# Patient Record
Sex: Female | Born: 1960 | Race: Black or African American | Hispanic: No | Marital: Married | Smoking: Former smoker
Health system: Southern US, Community
[De-identification: ages and names within clinical notes are randomized; demographics above are authoritative.]

## PROBLEM LIST (undated history)

## (undated) DIAGNOSIS — D126 Benign neoplasm of colon, unspecified: Secondary | ICD-10-CM

## (undated) DIAGNOSIS — M7062 Trochanteric bursitis, left hip: Secondary | ICD-10-CM

## (undated) DIAGNOSIS — Z8619 Personal history of other infectious and parasitic diseases: Secondary | ICD-10-CM

## (undated) DIAGNOSIS — R002 Palpitations: Secondary | ICD-10-CM

## (undated) DIAGNOSIS — M545 Low back pain, unspecified: Secondary | ICD-10-CM

## (undated) HISTORY — DX: Personal history of other infectious and parasitic diseases: Z86.19

## (undated) HISTORY — PX: LIPOMA EXCISION: SHX5283

## (undated) HISTORY — PX: BREAST BIOPSY: SHX20

## (undated) HISTORY — DX: Low back pain: M54.5

## (undated) HISTORY — DX: Trochanteric bursitis, left hip: M70.62

## (undated) HISTORY — DX: Low back pain, unspecified: M54.50

## (undated) HISTORY — DX: Palpitations: R00.2

## (undated) HISTORY — PX: COLONOSCOPY: SHX174

## (undated) HISTORY — PX: OTHER SURGICAL HISTORY: SHX169

## (undated) HISTORY — DX: Benign neoplasm of colon, unspecified: D12.6

---

## 1997-11-04 ENCOUNTER — Emergency Department (HOSPITAL_COMMUNITY): Admission: EM | Admit: 1997-11-04 | Discharge: 1997-11-04 | Payer: Self-pay | Admitting: Emergency Medicine

## 1999-08-13 ENCOUNTER — Ambulatory Visit (HOSPITAL_COMMUNITY): Admission: RE | Admit: 1999-08-13 | Discharge: 1999-08-13 | Payer: Self-pay | Admitting: General Surgery

## 1999-08-13 ENCOUNTER — Encounter (INDEPENDENT_AMBULATORY_CARE_PROVIDER_SITE_OTHER): Payer: Self-pay

## 2004-11-20 ENCOUNTER — Emergency Department (HOSPITAL_COMMUNITY): Admission: EM | Admit: 2004-11-20 | Discharge: 2004-11-20 | Payer: Self-pay | Admitting: Emergency Medicine

## 2010-01-11 ENCOUNTER — Encounter: Payer: Self-pay | Admitting: Cardiovascular Disease

## 2010-01-25 ENCOUNTER — Ambulatory Visit: Payer: Self-pay | Admitting: Cardiovascular Disease

## 2010-01-25 DIAGNOSIS — R0602 Shortness of breath: Secondary | ICD-10-CM | POA: Insufficient documentation

## 2010-01-25 DIAGNOSIS — R002 Palpitations: Secondary | ICD-10-CM

## 2010-07-16 NOTE — Letter (Signed)
Summary: PHI  PHI   Imported By: Harlon Flor 01/28/2010 10:43:17  _____________________________________________________________________  External Attachment:    Type:   Image     Comment:   External Document

## 2010-07-16 NOTE — Assessment & Plan Note (Signed)
Summary: NEW PT   Visit Type:  Initial Consult Primary Provider:  Dr. Sherrie Mustache  CC:  Family hx. of CAD.  c/o shortness of breath at times with skipping beats in heart.  .  History of Present Illness: 50 year old woman with no known cardiac history, family history of diabetes and heart disease who is a patient of Dr. Sherrie Mustache who presents for new patient evaluation for symptoms of tachycardia, chest tightness.  She reports that she is very active. She uses the elliptical and walks several times per week. She can walk at a regular/fast pace for 35-45 minutes with no symptoms of shortness of breath or chest discomfort.  Sometimes when she sits on the couch, she feels that her heart rate is beating up. This happens for several minutes at a time and then goes back to normal. she does not have this when she exercises. She denies any lower extremity edema, lightheadedness, shortness of breath.  She used to smoke but stopped approximately 10-15 years ago. Chest pain seems to come on at rest it is brief, in the middle of her chest. No associated with exertion.  EKG shows normal sinus rhythm with rate of 64 beats per minute, no significant ST or T wave changes.  Current Medications (verified): 1)  None  Allergies (verified): No Known Drug Allergies  Past History:  Past Surgical History: Last updated: January 30, 2010 Benign breast biopsy Fissurotomy  Family History: Last updated: 2010-01-30 Father: deceased at age 60.  Cause of death was cancer and heart disease. Mother: HTN, non-insulin dependent diabetes, alsheimer's disease. Patient's mother is living.  Brother: CHF and stroke Brother: CAD age 35's Sister: CAD with a stent at 38.  Social History: Last updated: 2010/01/30 Tobacco Use - Former. Previously smoked less than 1/2 pack per day for 5-10 years. Alcohol Use - yes occasional  Regular Exercise - yes walking  Full Time Divorced   Risk Factors: Exercise: yes (01-30-2010)  Risk  Factors: Smoking Status: quit (30-Jan-2010)  Family History: Father: deceased at age 102.  Cause of death was cancer and heart disease. Mother: HTN, non-insulin dependent diabetes, alsheimer's disease. Patient's mother is living.  Brother: CHF and stroke Brother: CAD age 4's Sister: CAD with a stent at 86.  Social History: Tobacco Use - Former. Previously smoked less than 1/2 pack per day for 5-10 years. Alcohol Use - yes occasional  Regular Exercise - yes walking  Full Time Divorced   Vital Signs:  Patient profile:   50 year old female Height:      69 inches Weight:      189 pounds BMI:     28.01 Pulse rate:   69 / minute BP sitting:   108 / 69  (left arm) Cuff size:   regular  Vitals Entered By: Bishop Dublin, CMA (2010-01-30 2:29 PM)  Physical Exam  General:  Well developed, well nourished, in no acute distress. Head:  normocephalic and atraumatic Neck:  Neck supple, no JVD. No masses, thyromegaly or abnormal cervical nodes. Lungs:  Clear bilaterally to auscultation and percussion. Heart:  Non-displaced PMI, chest non-tender; regular rate and rhythm, S1, S2 without murmurs, rubs or gallops. Carotid upstroke normal, no bruit. Pedals normal pulses. No edema, no varicosities. Abdomen:  Bowel sounds positive; abdomen soft and non-tender without masses Msk:  Back normal, normal gait. Muscle strength and tone normal. Pulses:  pulses normal in all 4 extremities Extremities:  No clubbing or cyanosis. Neurologic:  Alert and oriented x 3. Skin:  Intact  without lesions or rashes. Psych:  Normal affect.   Impression & Recommendations:  Problem # 1:  PALPITATIONS (ICD-785.1) palpitations/tachycardia may be due to an atrial tachycardia or some other rhythm that has not been identified. It is rare when they happen, brief period she is mildly symptomatic. She's not interested in taking any medication for it at this time. We did mention that if her symptoms get worse, we did  do an event monitor or Holter monitor. She will contact us if he gets worse. As his symptoms are rare and last only several minutes, medications would likely be of little benefit when taken p.r.n.  Orders: EKG w/ Interpretation (93000)  Problem # 2:  SHORTNESS OF BREATH (ICD-786.05) She is reactive, exercises without symptoms. It is very unlikely that she has underlying severe coronary disease with her exercise tolerance and lack of symptoms. Have asked her to contact me if she has worsening symptoms of shortness of breath or chest discomfort with exertion  Orders: EKG w/ Interpretation (93000)

## 2010-12-27 ENCOUNTER — Encounter: Payer: Self-pay | Admitting: Cardiovascular Disease

## 2011-09-02 LAB — HM PAP SMEAR: HM Pap smear: NEGATIVE

## 2012-10-19 DIAGNOSIS — N852 Hypertrophy of uterus: Secondary | ICD-10-CM | POA: Insufficient documentation

## 2012-11-02 LAB — HM HEPATITIS C SCREENING LAB: HM HEPATITIS C SCREENING: NEGATIVE

## 2013-08-25 ENCOUNTER — Ambulatory Visit: Payer: Self-pay | Admitting: Family Medicine

## 2013-12-05 ENCOUNTER — Ambulatory Visit (INDEPENDENT_AMBULATORY_CARE_PROVIDER_SITE_OTHER): Payer: Private Health Insurance - Indemnity | Admitting: Cardiovascular Disease

## 2013-12-05 ENCOUNTER — Encounter: Payer: Self-pay | Admitting: Cardiovascular Disease

## 2013-12-05 ENCOUNTER — Encounter (INDEPENDENT_AMBULATORY_CARE_PROVIDER_SITE_OTHER): Payer: Self-pay

## 2013-12-05 VITALS — BP 124/82 | HR 68 | Ht 69.0 in | Wt 204.8 lb

## 2013-12-05 DIAGNOSIS — E785 Hyperlipidemia, unspecified: Secondary | ICD-10-CM

## 2013-12-05 DIAGNOSIS — R0602 Shortness of breath: Secondary | ICD-10-CM

## 2013-12-05 DIAGNOSIS — R002 Palpitations: Secondary | ICD-10-CM

## 2013-12-05 NOTE — Assessment & Plan Note (Signed)
We talked about her cholesterol. We do not have the most recent numbers. She does not want a prescription and I do not think this is necessary. She will try weight loss, exercise, watching her diet and consider red yeast rice

## 2013-12-05 NOTE — Progress Notes (Signed)
   Patient ID: Tina Fuller, female    DOB: October 10, 1960, 53 y.o.   MRN: [id number]  HPI Comments: 53 year old woman with no known cardiac history, family history of diabetes and heart disease who is a patient of Dr. Caryn Section who was previously seen in 2011 for tachycardia, chest tightness. She presents for routine followup  In followup today, she continues to work at [employer]. She spends long hours on her feet, does significant lifting and walking without symptoms. Occasionally she has shortness of breath with exertion. She feels tired, has arthritis in her back. She is no longer exercising as she use to. She denies any further episodes of tachycardia. She does have some lower extremity edema which she attributes to standing all day. Minimal chest discomfort, they're very mild, at rest, not with exertion   She used to smoke but stopped approximately 10-15 years ago. Chest pain seems to come on at rest it is brief, in the middle of her chest. No associated with exertion.  Labwork from may    2014 showing total cholesterol 200, LDL 111, creatinine 0.74 EKG shows normal sinus rhythm with rate of 68 beats per minute, no significant ST or T wave changes.   Outpatient Encounter Prescriptions as of 12/05/2013  Medication Sig  . HYDROcodone-acetaminophen (NORCO/VICODIN) 5-325 MG per tablet 1-2 tablets every 4 (four) hours as needed. 1 tablet every 4 (four) hours as needed.  . Multiple Vitamin (MULTIVITAMIN) tablet Take 1 tablet by mouth daily.     Review of Systems  Constitutional: Positive for fatigue.  HENT: Negative.   Eyes: Negative.   Respiratory: Negative.   Cardiovascular: Negative.   Gastrointestinal: Negative.   Endocrine: Negative.   Musculoskeletal: Negative.   Skin: Negative.   Allergic/Immunologic: Negative.   Neurological: Negative.   Hematological: Negative.   Psychiatric/Behavioral: Negative.   All other systems reviewed and are negative.   BP 124/82  Pulse 68   Ht 5\' 9"  (1.753 m)  Wt 204 lb 12 oz (92.874 kg)  BMI 30.22 kg/m2  Physical Exam  Nursing note and vitals reviewed. Constitutional: She is oriented to person, place, and time. She appears well-developed and well-nourished.  HENT:  Head: Normocephalic.  Nose: Nose normal.  Mouth/Throat: Oropharynx is clear and moist.  Eyes: Conjunctivae are normal. Pupils are equal, round, and reactive to light.  Neck: Normal range of motion. Neck supple. No JVD present.  Cardiovascular: Normal rate, regular rhythm, S1 normal, S2 normal, normal heart sounds and intact distal pulses.  Exam reveals no gallop and no friction rub.   No murmur heard. Pulmonary/Chest: Effort normal and breath sounds normal. No respiratory distress. She has no wheezes. She has no rales. She exhibits no tenderness.  Abdominal: Soft. Bowel sounds are normal. She exhibits no distension. There is no tenderness.  Musculoskeletal: Normal range of motion. She exhibits no edema and no tenderness.  Lymphadenopathy:    She has no cervical adenopathy.  Neurological: She is alert and oriented to person, place, and time. Coordination normal.  Skin: Skin is warm and dry. No rash noted. No erythema.  Psychiatric: She has a normal mood and affect. Her behavior is normal. Judgment and thought content normal.    Assessment and Plan

## 2013-12-05 NOTE — Patient Instructions (Signed)
You are doing well. No medication changes were made.  Look for RED YEAST RICE for cholesterol  Please call us if you have new issues that need to be addressed before your next appt.  Your physician wants you to follow-up in: 12 months.  You will receive a reminder letter in the mail two months in advance. If you don't receive a letter, please call our office to schedule the follow-up appointment.

## 2013-12-05 NOTE — Assessment & Plan Note (Signed)
She denies having significant symptoms of palpitations or tachycardia.

## 2013-12-05 NOTE — Assessment & Plan Note (Signed)
Premolded and will symptoms. She is active at work. Likely secondary to deconditioning and weight. Recommended she start a regular exercise program clinical exam is essentially benign

## 2014-08-15 DIAGNOSIS — D126 Benign neoplasm of colon, unspecified: Secondary | ICD-10-CM

## 2014-08-15 HISTORY — DX: Benign neoplasm of colon, unspecified: D12.6

## 2014-08-29 ENCOUNTER — Ambulatory Visit: Payer: Self-pay | Admitting: Gastroenterology

## 2014-09-21 LAB — LIPID PANEL
CHOLESTEROL: 197 mg/dL (ref 0–200)
HDL: 88 mg/dL — AB (ref 35–70)
LDL Cholesterol: 97 mg/dL
TRIGLYCERIDES: 62 mg/dL (ref 40–160)

## 2014-09-21 LAB — BASIC METABOLIC PANEL
BUN: 10 mg/dL (ref 4–21)
Creatinine: 0.6 mg/dL (ref 0.5–1.1)
Glucose: 90 mg/dL
Potassium: 4.5 mmol/L (ref 3.4–5.3)
SODIUM: 139 mmol/L (ref 137–147)

## 2014-09-21 LAB — HEPATIC FUNCTION PANEL
ALT: 9 U/L (ref 7–35)
AST: 16 U/L (ref 13–35)

## 2014-10-09 LAB — SURGICAL PATHOLOGY

## 2014-12-27 ENCOUNTER — Ambulatory Visit: Payer: Private Health Insurance - Indemnity | Admitting: Cardiovascular Disease

## 2014-12-29 ENCOUNTER — Encounter: Payer: Self-pay | Admitting: *Deleted

## 2014-12-29 ENCOUNTER — Ambulatory Visit: Payer: Private Health Insurance - Indemnity | Admitting: Cardiovascular Disease

## 2015-01-31 LAB — HM PAP SMEAR: HM PAP: NEGATIVE

## 2015-02-05 DIAGNOSIS — E010 Iodine-deficiency related diffuse (endemic) goiter: Secondary | ICD-10-CM | POA: Insufficient documentation

## 2015-02-22 LAB — HM MAMMOGRAPHY

## 2015-03-21 ENCOUNTER — Encounter: Payer: Self-pay | Admitting: Cardiovascular Disease

## 2015-03-21 ENCOUNTER — Ambulatory Visit (INDEPENDENT_AMBULATORY_CARE_PROVIDER_SITE_OTHER): Payer: Managed Care, Other (non HMO) | Admitting: Cardiovascular Disease

## 2015-03-21 VITALS — BP 128/70 | HR 83 | Ht 68.0 in | Wt 208.2 lb

## 2015-03-21 DIAGNOSIS — E785 Hyperlipidemia, unspecified: Secondary | ICD-10-CM

## 2015-03-21 DIAGNOSIS — R0602 Shortness of breath: Secondary | ICD-10-CM

## 2015-03-21 DIAGNOSIS — E669 Obesity, unspecified: Secondary | ICD-10-CM

## 2015-03-21 DIAGNOSIS — R002 Palpitations: Secondary | ICD-10-CM

## 2015-03-21 NOTE — Patient Instructions (Signed)
You are doing well. No medication changes were made.  Please call us if you have new issues that need to be addressed before your next appt.  Your physician wants you to follow-up in: 12 months.  You will receive a reminder letter in the mail two months in advance. If you don't receive a letter, please call our office to schedule the follow-up appointment. 

## 2015-03-21 NOTE — Progress Notes (Signed)
Patient ID: Tina Fuller, female    DOB: 09-18-1960, 54 y.o.   MRN: [id number]  HPI Comments: 54 year old woman with obesity,  no known cardiac history, family history of diabetes and heart disease who is a patient of Dr. Caryn Section who was previously seen in 2011 for tachycardia, chest tightness. She presents for routine followup of her blood pressure, risk factors.  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  She spends long hours on her feet, does significant lifting and walking without symptoms.  Weight is up 3 pounds from her prior clinic visit  She is no longer exercising as she use to. She denies any further episodes of tachycardia.  She does have some lower extremity edema which she attributes to standing all day.   She used to smoke but stopped approximately 10-15 years ago. She reports strong family history of congestive heart failure  EKG shows normal sinus rhythm with rate of 83 beats per minute, no significant ST or T wave changes.  Labwork from may    2014 showing total cholesterol 200, LDL 111, creatinine 0.74 No recent lab work available   No Known Allergies  Current Outpatient Prescriptions on File Prior to Visit  Medication Sig Dispense Refill  . HYDROcodone-acetaminophen (NORCO/VICODIN) 5-325 MG per tablet 1-2 tablets every 4 (four) hours as needed. 1 tablet every 4 (four) hours as needed.    . Multiple Vitamin (MULTIVITAMIN) tablet Take 1 tablet by mouth daily.     No current facility-administered medications on file prior to visit.    Past Medical History  Diagnosis Date  . Palpitations     Past Surgical History  Procedure Laterality Date  . Beign breast biopsy    . Fissurotomy      Social History  reports that she has quit smoking. She does not have any smokeless tobacco history on file. She reports that she drinks alcohol. She reports that she does not use illicit drugs.  Family History family history  includes Heart disease in her brother and brother; Heart failure in her father; Hyperlipidemia in her brother, brother, father, and mother; Hypertension in her brother, brother, father, and mother.    Review of Systems  Respiratory: Negative.   Cardiovascular: Negative.   Gastrointestinal: Negative.   Musculoskeletal: Negative.   Neurological: Negative.   Hematological: Negative.   Psychiatric/Behavioral: Negative.   All other systems reviewed and are negative.   BP 128/70 mmHg  Pulse 83  Ht 5\' 8"  (1.727 m)  Wt 208 lb 4 oz (94.462 kg)  BMI 31.67 kg/m2  Physical Exam  Constitutional: She is oriented to person, place, and time. She appears well-developed and well-nourished.  Obese  HENT:  Head: Normocephalic.  Nose: Nose normal.  Mouth/Throat: Oropharynx is clear and moist.  Eyes: Conjunctivae are normal. Pupils are equal, round, and reactive to light.  Neck: Normal range of motion. Neck supple. No JVD present.  Cardiovascular: Normal rate, regular rhythm, S1 normal, S2 normal, normal heart sounds and intact distal pulses.  Exam reveals no gallop and no friction rub.   No murmur heard. Pulmonary/Chest: Effort normal and breath sounds normal. No respiratory distress. She has no wheezes. She has no rales. She exhibits no tenderness.  Abdominal: Soft. Bowel sounds are normal. She exhibits no distension. There is no tenderness.  Musculoskeletal: Normal range of motion. She exhibits no edema or tenderness.  Lymphadenopathy:    She has no cervical adenopathy.  Neurological: She is alert and  oriented to person, place, and time. Coordination normal.  Skin: Skin is warm and dry. No rash noted. No erythema.  Psychiatric: She has a normal mood and affect. Her behavior is normal. Judgment and thought content normal.    Assessment and Plan  Nursing note and vitals reviewed.

## 2015-03-21 NOTE — Assessment & Plan Note (Signed)
We have encouraged continued exercise, careful diet management in an effort to lose weight. 

## 2015-03-21 NOTE — Assessment & Plan Note (Signed)
Previous total cholesterol around 200. We did discuss CT coronary calcium scoring. This could be done if she has any chest pain or if she is concerned about management of her cholesterol

## 2015-03-21 NOTE — Assessment & Plan Note (Signed)
Currently not for reactive with exercise at this time. Denies having any significant shortness of breath with exertion No further workup at this time

## 2015-05-21 ENCOUNTER — Encounter: Payer: Self-pay | Admitting: *Deleted

## 2015-05-21 DIAGNOSIS — F4320 Adjustment disorder, unspecified: Secondary | ICD-10-CM | POA: Insufficient documentation

## 2015-05-21 DIAGNOSIS — D126 Benign neoplasm of colon, unspecified: Secondary | ICD-10-CM | POA: Insufficient documentation

## 2015-05-21 DIAGNOSIS — M5430 Sciatica, unspecified side: Secondary | ICD-10-CM | POA: Insufficient documentation

## 2015-05-21 DIAGNOSIS — M545 Low back pain: Secondary | ICD-10-CM | POA: Insufficient documentation

## 2015-05-21 DIAGNOSIS — M7062 Trochanteric bursitis, left hip: Secondary | ICD-10-CM | POA: Insufficient documentation

## 2015-05-21 DIAGNOSIS — R5383 Other fatigue: Secondary | ICD-10-CM

## 2015-05-21 DIAGNOSIS — IMO0001 Reserved for inherently not codable concepts without codable children: Secondary | ICD-10-CM | POA: Insufficient documentation

## 2015-05-21 DIAGNOSIS — M79605 Pain in left leg: Secondary | ICD-10-CM

## 2015-05-21 DIAGNOSIS — M25569 Pain in unspecified knee: Secondary | ICD-10-CM | POA: Insufficient documentation

## 2015-05-21 DIAGNOSIS — O039 Complete or unspecified spontaneous abortion without complication: Secondary | ICD-10-CM | POA: Insufficient documentation

## 2015-05-21 HISTORY — DX: Trochanteric bursitis, left hip: M70.62

## 2015-05-22 ENCOUNTER — Ambulatory Visit: Payer: Self-pay | Admitting: Family Medicine

## 2015-07-17 ENCOUNTER — Encounter: Payer: Self-pay | Admitting: Family Medicine

## 2015-07-17 ENCOUNTER — Ambulatory Visit (INDEPENDENT_AMBULATORY_CARE_PROVIDER_SITE_OTHER): Payer: Managed Care, Other (non HMO) | Admitting: Family Medicine

## 2015-07-17 DIAGNOSIS — Z0000 Encounter for general adult medical examination without abnormal findings: Secondary | ICD-10-CM | POA: Diagnosis not present

## 2015-07-17 DIAGNOSIS — N390 Urinary tract infection, site not specified: Secondary | ICD-10-CM | POA: Diagnosis not present

## 2015-07-17 DIAGNOSIS — R109 Unspecified abdominal pain: Secondary | ICD-10-CM

## 2015-07-17 DIAGNOSIS — R319 Hematuria, unspecified: Secondary | ICD-10-CM

## 2015-07-17 LAB — POCT URINALYSIS DIPSTICK
Bilirubin, UA: NEGATIVE
Glucose, UA: NEGATIVE
KETONES UA: NEGATIVE
LEUKOCYTES UA: NEGATIVE
Nitrite, UA: NEGATIVE
PH UA: 5
Urobilinogen, UA: 2

## 2015-07-17 NOTE — Progress Notes (Signed)
Patient: Tina Fuller, Female    DOB: 06-05-1961, 55 y.o.   MRN: [id number] Visit Date: 07/17/2015  Today's Provider: Lelon Huh, MD   Chief Complaint  Patient presents with  . Annual Exam   Subjective:    Annual physical exam Tina Fuller is a 55 y.o. female who presents today for health maintenance and complete physical. She feels fairly well. She reports exercising; yes/walking. She reports she is sleeping poorly/5 hours per night. She is followed by Dr. Rockey Situ for palpitations. She is followed by [facility] for pap, breast and mammogram.   -----------------------------------------------------------------  She complains of frequent right sided flank pain x2 months. It is worse after eating, feels like an ache. No associated nausea or vomiting.      Review of Systems  Constitutional: Negative.   HENT: Positive for dental problem and sinus pressure.   Eyes: Positive for photophobia and pain.  Respiratory: Negative.   Cardiovascular: Positive for palpitations.  Gastrointestinal: Positive for abdominal pain.  Endocrine: Negative.   Genitourinary: Negative.   Musculoskeletal: Positive for back pain and arthralgias.  Skin: Negative.   Allergic/Immunologic: Negative.   Neurological: Negative.   Hematological: Negative.   Psychiatric/Behavioral: Negative.     Social History      She  reports that she has quit smoking. She does not have any smokeless tobacco history on file. She reports that she drinks alcohol. She reports that she does not use illicit drugs.       Social History   Social History  . Marital Status: Divorced    Spouse Name: N/A  . Number of Children: 2  . Years of Education: GED   Occupational History  . Full-Time Employee  Geophysicist/field seismologist Department   Social History Main Topics  . Smoking status: Former Smoker -- 0.25 packs/day for 8 years  . Smokeless tobacco: None     Comment: smoked less than 1/2 ppd for 5-10 years   .  Alcohol Use: Yes     Comment: occasional   . Drug Use: No  . Sexual Activity: Not Asked   Other Topics Concern  . None   Social History Narrative   Divorced; full time; exercises regularly - walking     Past Medical History  Diagnosis Date  . Palpitations   . Tubular adenoma of colon 08/2014  . Low back pain   . Trochanteric bursitis of left hip   . History of chicken pox      Patient Active Problem List   Diagnosis Date Noted  . Abortion 05/21/2015  . Gonalgia 05/21/2015  . 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 Low back pain radiating to left leg 05/21/2015  . Neuralgia neuritis, sciatic nerve 05/21/2015  . Trochanteric bursitis of left hip 05/21/2015  . Tubular adenoma of colon 05/21/2015  . Fatigue 05/21/2015  . Obesity 03/21/2015  . Big thyroid 02/05/2015  . Hyperlipidemia 12/05/2013  . Bulky or enlarged uterus 10/19/2012  . PALPITATIONS 01/25/2010  . SHORTNESS OF BREATH 01/25/2010    Past Surgical History  Procedure Laterality Date  . Beign breast biopsy    . Fissurotomy      Family History        Family Status  Relation Status Death Age  . Father Deceased 39    heart disease and CA  . Mother Deceased 1    HTN; non-insulin dependent diabetes, alzheimers  . Brother Alive     CHF and  stroke  . Brother Alive     CAD in 51s  . Sister Alive     CAD with a stent at 5         Her family history includes Alzheimer's disease in her mother; Cerebrovascular Accident in her father and mother; Diabetes in her brother, mother, and sister; Heart disease in her brother, brother, and sister; Heart failure in her father; Hyperlipidemia in her brother, brother, father, and mother; Hypertension in her brother, brother, father, and mother.    No Known Allergies  Previous Medications   MULTIPLE VITAMIN (MULTIVITAMIN) TABLET    Take 1 tablet by mouth daily.    Patient Care Team: Jerrol Banana., MD as PCP - General (Family Medicine)     Objective:   Vitals:  BP 120/80 mmHg  Pulse 76  Temp(Src) 98.8 F (37.1 C) (Oral)  Resp 16  Ht 5\' 8"  ([id number] m)  Wt 209 lb (94.802 kg)  BMI 31.79 kg/m2  LMP 07/13/2015   Physical Exam   General Appearance:    Alert, cooperative, no distress, appears stated age, obese  Head:    Normocephalic, without obvious abnormality, atraumatic  Eyes:    PERRL, conjunctiva/corneas clear, EOM's intact, fundi    benign, both eyes  Ears:    Normal TM's and external ear canals, both ears  Nose:   Nares normal, septum midline, mucosa normal, no drainage    or sinus tenderness  Throat:   Lips, mucosa, and tongue normal; teeth and gums normal  Neck:   Supple, symmetrical, trachea midline, no adenopathy;    thyroid:  no enlargement/tenderness/nodules; no carotid   bruit or JVD  Back:     Symmetric, no curvature, ROM normal, no CVA tenderness  Lungs:     Clear to auscultation bilaterally, respirations unlabored  Chest Wall:    No tenderness or deformity   Heart:    Regular rate and rhythm, S1 and S2 normal, no murmur, rub   or gallop  Breast Exam:    deferred  Abdomen:     Soft, non-tender, bowel sounds active all four quadrants,    no masses, no organomegaly. No CVAT  Pelvic:    deferred  Extremities:   Extremities normal, atraumatic, no cyanosis or edema  Pulses:   2+ and symmetric all extremities  Skin:   Skin color, texture, turgor normal, no rashes or lesions  Lymph nodes:   Cervical, supraclavicular, and axillary nodes normal  Neurologic:   CNII-XII intact, normal strength, sensation and reflexes    throughout    Depression Screen PHQ 2/9 Scores 07/17/2015  PHQ - 2 Score 3  PHQ- 9 Score 7    Results for orders placed or performed in visit on 07/17/15  POCT urinalysis dipstick  Result Value Ref Range   Color, UA Amber    Clarity, UA Turbid    Glucose, UA Neg    Bilirubin, UA Neg    Ketones, UA Neg    Spec Grav, UA >=1.030    Blood, UA Large    pH, UA 5.0    Protein, UA Trace    Urobilinogen, UA 2.0     Nitrite, UA Neg    Leukocytes, UA Negative Negative     Assessment & Plan:     Routine Health Maintenance and Physical Exam  Exercise Activities and Dietary recommendations Goals    None      Immunization History  Administered Date(s) Administered  . Tdap 11/18/2006    Health  Maintenance  Topic Date Due  . Hepatitis C Screening  01/28/1961  . HIV Screening  04/09/1978  . MAMMOGRAM  04/09/2013  . COLONOSCOPY  04/09/2013  . PAP SMEAR  09/02/2014  . INFLUENZA VACCINE  01/15/2015  . TETANUS/TDAP  11/17/2016      Discussed health benefits of physical activity, and encouraged her to engage in regular exercise appropriate for her age and condition.    --------------------------------------------------------------------  1. Annual physical exam UTD on health maintenence  2. Left Flank pain Discussed extensive ddx. If labs are normal will need abdominal ultrasound - POCT urinalysis dipstick - CBC - Comprehensive metabolic panel - H Pylori, IGM, IGG, IGA AB - Amylase  3. Hematuria She is on her menstrual cycle. Will culture urine.   4. Urinary tract infection with hematuria, site unspecified  - Urine culture

## 2015-07-18 ENCOUNTER — Telehealth: Payer: Self-pay | Admitting: *Deleted

## 2015-07-18 LAB — URINE CULTURE

## 2015-07-18 MED ORDER — CLARITHROMYCIN 500 MG PO TABS: 500.0000 mg | ORAL_TABLET | Freq: Two times a day (BID) | ORAL | Status: DC

## 2015-07-18 MED ORDER — AMOXICILLIN 500 MG PO TABS
500.0000 mg | ORAL_TABLET | Freq: Two times a day (BID) | ORAL | Status: DC
Start: 2015-07-18 — End: 2015-08-22

## 2015-07-18 NOTE — Telephone Encounter (Signed)
Patient was notified of results. Patient expressed understanding. Rx's were sent to pharmacy.

## 2015-07-18 NOTE — Telephone Encounter (Signed)
-----   Message from Birdie Sons, MD sent at 07/18/2015  1:47 PM EST ----- Labs are positive for h. Pylori which can cause gastritis and stomach ulcers. Need to start amoxicillin 500mg  2 twice a day for 14 days (#56), clarithromycin 500mg  one tablet twice a day for 14 days (#28). And prilosec OTC 20mg , one twice a day for 14 days. Follow up o.v. In 2-3 weeks.

## 2015-07-19 ENCOUNTER — Telehealth: Payer: Self-pay | Admitting: Family Medicine

## 2015-07-19 LAB — COMPREHENSIVE METABOLIC PANEL
ALBUMIN: 4.2 g/dL (ref 3.5–5.5)
ALT: 10 IU/L (ref 0–32)
AST: 15 IU/L (ref 0–40)
Albumin/Globulin Ratio: 1.4 (ref 1.1–2.5)
Alkaline Phosphatase: 59 IU/L (ref 39–117)
BUN / CREAT RATIO: 14 (ref 9–23)
BUN: 14 mg/dL (ref 6–24)
Bilirubin Total: <0.2 mg/dL (ref 0.0–1.2)
CALCIUM: 9.4 mg/dL (ref 8.7–10.2)
CO2: 23 mmol/L (ref 18–29)
CREATININE: 0.97 mg/dL (ref 0.57–1.00)
Chloride: 102 mmol/L (ref 96–106)
GFR calc non Af Amer: 67 mL/min/{1.73_m2} (ref >59)
GFR, EST AFRICAN AMERICAN: 78 mL/min/{1.73_m2} (ref >59)
GLUCOSE: 86 mg/dL (ref 65–99)
Globulin, Total: 3 g/dL (ref 1.5–4.5)
Potassium: 4.7 mmol/L (ref 3.5–5.2)
Sodium: 139 mmol/L (ref 134–144)
TOTAL PROTEIN: 7.2 g/dL (ref 6.0–8.5)

## 2015-07-19 LAB — CBC
HEMOGLOBIN: 12.3 g/dL (ref 11.1–15.9)
Hematocrit: 36.6 % (ref 34.0–46.6)
MCH: 30.2 pg (ref 26.6–33.0)
MCHC: 33.6 g/dL (ref 31.5–35.7)
MCV: 90 fL (ref 79–97)
Platelets: 371 10*3/uL (ref 150–379)
RBC: 4.07 x10E6/uL (ref 3.77–5.28)
RDW: 13.5 % (ref 12.3–15.4)
WBC: 5.1 10*3/uL (ref 3.4–10.8)

## 2015-07-19 LAB — H PYLORI, IGM, IGG, IGA AB
H Pylori IgG: >8 U/mL — ABNORMAL HIGH (ref 0.0–0.8)
H pylori, IgM Abs: <9 units (ref 0.0–8.9)
H. pylori, IgA Abs: <9 units (ref 0.0–8.9)

## 2015-07-19 LAB — AMYLASE: AMYLASE: 66 U/L (ref 31–124)

## 2015-07-19 NOTE — Telephone Encounter (Signed)
Patient called office concerning clarithromycin rx that was sent to pharmacy yesterday. Clarithromycin is going to cost her out of pocket $94 for generic. Is there another medication that can be substituted for this? Please advise?

## 2015-07-19 NOTE — Telephone Encounter (Signed)
Pt would like to speak with Sharyn Lull about her medications. Pt wouldn't go into detail. Thanks TNP

## 2015-07-19 NOTE — Telephone Encounter (Signed)
There are no alternatives to treat h. Pylori that are any less expensive.

## 2015-07-19 NOTE — Telephone Encounter (Signed)
Patient was notified.

## 2015-08-01 ENCOUNTER — Telehealth: Payer: Self-pay | Admitting: Family Medicine

## 2015-08-01 MED ORDER — FLUCONAZOLE 150 MG PO TABS
150.0000 mg | ORAL_TABLET | Freq: Once | ORAL | Status: DC
Start: 2015-08-01 — End: 2015-08-22

## 2015-08-01 NOTE — Telephone Encounter (Signed)
Pt stated that the antibiotics that she has been taking has caused a yeast infection and would like something in the pill form sent to [facility] to treat. Please advise. Thanks TNP

## 2015-08-01 NOTE — Telephone Encounter (Signed)
Please review

## 2015-08-22 ENCOUNTER — Ambulatory Visit (INDEPENDENT_AMBULATORY_CARE_PROVIDER_SITE_OTHER): Payer: Managed Care, Other (non HMO) | Admitting: Family Medicine

## 2015-08-22 ENCOUNTER — Encounter: Payer: Self-pay | Admitting: Family Medicine

## 2015-08-22 VITALS — BP 140/80 | HR 65 | Temp 97.6°F | Resp 18 | Wt 209.0 lb

## 2015-08-22 DIAGNOSIS — K2970 Gastritis, unspecified, without bleeding: Principal | ICD-10-CM

## 2015-08-22 DIAGNOSIS — B9681 Helicobacter pylori [H. pylori] as the cause of diseases classified elsewhere: Secondary | ICD-10-CM | POA: Diagnosis not present

## 2015-08-22 NOTE — Progress Notes (Signed)
       Patient: Tina Fuller Female    DOB: 12-Jun-1961   55 y.o.   MRN: [id number] Visit Date: 08/22/2015  Today's Provider: Lelon Huh, MD   Chief Complaint  Patient presents with  . Follow-up    H. pylori infection   Subjective:    HPI Follow up H. Pylori infection: Patient was last seen 07/17/2015 for symptoms of pain in the Left flank. Labs were ordered in which patient tested positive for H. Pylori. Patient was started on Amoxicillin, Clarithromycin and Prilosec for 14 days. Today patient comes in stating she has completed all doses of the medication. Patient reports she still has pain in her left side whenever she eats something spicy, but has otherwise resolved.  She tolerated antiobiotics well except for symptoms of yeast infection which have mostly resolved.    No Known Allergies Previous Medications   No medications on file    Review of Systems  Constitutional: Negative for fever, chills, appetite change, fatigue and unexpected weight change.  Respiratory: Negative for chest tightness and shortness of breath.   Cardiovascular: Negative for chest pain and palpitations.  Gastrointestinal: Negative for nausea, vomiting, abdominal pain, diarrhea, constipation, blood in stool and anal bleeding.       Left flank pain  Neurological: Negative for dizziness and weakness.    Social History  Substance Use Topics  . Smoking status: Former Smoker -- 0.25 packs/day for 8 years  . Smokeless tobacco: Not on file     Comment: smoked less than 1/2 ppd for 5-10 years   . Alcohol Use: Yes     Comment: occasional    Objective:   BP 140/80 mmHg  Pulse 65  Temp(Src) 97.6 F (36.4 C) (Oral)  Resp 18  Wt 209 lb (94.802 kg)  SpO2 99%  Physical Exam  General appearance: alert, well developed, well nourished, cooperative and in no distress Head: Normocephalic, without obvious abnormality, atraumatic Lungs: Respirations even and unlabored Extremities: No gross  deformities Skin: Skin color, texture, turgor normal. No rashes seen  Psych: Appropriate mood and affect. Neurologic: Mental status: Alert, oriented to person, place, and time, thought content appropriate.     Assessment & Plan:     1. Helicobacter pylori gastritis Improved after triple therapy.   - H. pylori breath test       Lelon Huh, MD  [facility]

## 2015-08-22 NOTE — Patient Instructions (Signed)

## 2015-08-24 LAB — H. PYLORI BREATH TEST: H. pylori UBiT: NEGATIVE

## 2016-04-30 ENCOUNTER — Encounter: Payer: Self-pay | Admitting: Cardiovascular Disease

## 2016-04-30 ENCOUNTER — Ambulatory Visit (INDEPENDENT_AMBULATORY_CARE_PROVIDER_SITE_OTHER): Payer: Managed Care, Other (non HMO) | Admitting: Cardiovascular Disease

## 2016-04-30 VITALS — BP 140/80 | HR 82 | Ht 69.0 in | Wt 213.5 lb

## 2016-04-30 DIAGNOSIS — R002 Palpitations: Secondary | ICD-10-CM

## 2016-04-30 DIAGNOSIS — M79605 Pain in left leg: Secondary | ICD-10-CM

## 2016-04-30 DIAGNOSIS — R0602 Shortness of breath: Secondary | ICD-10-CM

## 2016-04-30 DIAGNOSIS — E6609 Other obesity due to excess calories: Secondary | ICD-10-CM | POA: Diagnosis not present

## 2016-04-30 DIAGNOSIS — Z6831 Body mass index (BMI) 31.0-31.9, adult: Secondary | ICD-10-CM

## 2016-04-30 DIAGNOSIS — E7800 Pure hypercholesterolemia, unspecified: Secondary | ICD-10-CM | POA: Diagnosis not present

## 2016-04-30 NOTE — Progress Notes (Signed)
Cardiology Office Note  Date:  04/30/2016   ID:  Tina Fuller, Tina Fuller 06-08-1961, MRN [id number]  PCP:  Lelon Huh, MD   Chief Complaint  Patient presents with  . other    12 month follow up. Meds reviewed by the pt. verbally. "doing well."    HPI:  55 year old woman with obesity,  no known cardiac history, family history of diabetes and heart disease who is a patient of Dr. Caryn Section who was previously seen in 2011 for tachycardia, chest tightness. She presents for routine followup of her blood pressure, risk factors.  In follow-up today she reports that she has been having cramping in her legs at nighttime. 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  spends long hours on her feet, does significant lifting and walking  Weight continues to trend upwards No regular exercise program She denies any further episodes of tachycardia.  No significant leg edema   She used to smoke but stopped approximately 15 years ago. She reports strong family history of congestive heart failure  EKG shows normal sinus rhythm with rate of 82 beats per minute, no significant ST or T wave changes.  Labwork from may    2014 showing total cholesterol 200, LDL 111, creatinine 0.74 No recent lab work available  PMH:   has a past medical history of History of chicken pox; Low back pain; Palpitations; Trochanteric bursitis of left hip; and Tubular adenoma of colon (08/2014).  PSH:    Past Surgical History:  Procedure Laterality Date  . beign breast biopsy    . fissurotomy      No current outpatient prescriptions on file.   No current facility-administered medications for this visit.      Allergies:   Patient has no known allergies.   Social History:  The patient  reports that she has quit smoking. She has a 2.00 pack-year smoking history. She has never used smokeless tobacco. She reports  that she drinks alcohol. She reports that she does not use drugs.   Family History:   family history includes Alzheimer's disease in her mother; Cerebrovascular Accident in her father and mother; Diabetes in her brother, mother, and sister; Heart disease in her brother, brother, and sister; Heart failure in her father; Hyperlipidemia in her brother, brother, father, and mother; Hypertension in her brother, brother, father, and mother.    Review of Systems: Review of Systems  Constitutional: Negative.   Respiratory: Negative.   Cardiovascular: Negative.   Gastrointestinal: Negative.   Musculoskeletal: Positive for myalgias.       Leg pain  Neurological: Negative.   Psychiatric/Behavioral: Negative.   All other systems reviewed and are negative.    PHYSICAL EXAM: VS:  BP 140/80 (BP Location: Left Arm, Patient Position: Sitting, Cuff Size: Normal)   Pulse 82   Ht 5\' 9"  (1.753 m)   Wt 213 lb 8 oz (96.8 kg)   BMI 31.53 kg/m  , BMI Body mass index is 31.53 kg/m. GEN: Well nourished, well developed, in no acute distress  HEENT: normal  Neck: no JVD, carotid bruits, or masses Cardiac: RRR; no murmurs, rubs, or gallops,no edema  Respiratory:  clear to auscultation bilaterally, normal work of breathing GI: soft, nontender, nondistended, + BS MS: no deformity or atrophy  Skin: warm and dry, no rash Neuro:  Strength and sensation are intact Psych: euthymic mood, full affect  Recent Labs: 07/17/2015: ALT 10; BUN 14; Creatinine, Ser 0.97; Platelets 371; Potassium 4.7; Sodium 139    Lipid Panel Lab Results  Component Value Date   CHOL 197 09/21/2014   HDL 88 (A) 09/21/2014   LDLCALC 97 09/21/2014   TRIG 62 09/21/2014      Wt Readings from Last 3 Encounters:  04/30/16 213 lb 8 oz (96.8 kg)  08/22/15 209 lb (94.8 kg)  07/17/15 209 lb (94.8 kg)       ASSESSMENT AND PLAN:  Palpitations - Plan: EKG 12-Lead Rare episodes of palpitations No medications needed  Pure  hypercholesterolemia Currently not on a cholesterol medication We have encouraged continued exercise, careful diet management in an effort to lose weight.  Shortness of breath Recommended regular exercise program No evidence of CHF  Class 1 obesity due to excess calories without serious comorbidity with body mass index (BMI) of 31.0 to 31.9 in adult Recommended low, hydrate diet Various dietary changes discussed with her  Left leg pain Likely musculoskeletal, She has good extremity/distal pulses, no PAD suspected Long discussion concerning risk factors for PAD She has very few at this time   Total encounter time more than 25 minutes  Greater than 50% was spent in counseling and coordination of care with the patient   Disposition:   F/U  12 months   Orders Placed This Encounter  Procedures  . EKG 12-Lead     Signed, Esmond Plants, M.D., Ph.D. 04/30/2016  [place], [place]

## 2016-04-30 NOTE — Patient Instructions (Signed)

## 2016-07-17 ENCOUNTER — Encounter: Payer: Managed Care, Other (non HMO) | Admitting: Family Medicine

## 2016-12-04 ENCOUNTER — Telehealth: Payer: Self-pay

## 2016-12-04 NOTE — Telephone Encounter (Signed)
Please advise 

## 2016-12-04 NOTE — Telephone Encounter (Signed)
Pt does not currently have insurance and would like advise on BM changes. Last colonoscopy per health data archiver was 08/29/2014 (Dr. Allen Norris). It showed tubular adenoma. Pt is concerned because the tubular adenoma and new BM changes. Denies hematochezia, melena, abdominal pain, vomiting. Her main complaint is loose stools and color change. Color ranges from dark brown to a light color. She is also c/o some nausea. Please advise. Renaldo Fiddler, CMA

## 2016-12-05 NOTE — Telephone Encounter (Signed)
Adenoma grow very slowly so usual follow up for tubular adenoma is colonoscopy every 5 years. It is unlikely to be related to her current symptoms. Recommend she start taking metamucil powder every nigh. Give it about 2 weeks to regulate bowels, if still not bette then will need office visit.

## 2016-12-05 NOTE — Telephone Encounter (Signed)
[id number] 

## 2016-12-09 ENCOUNTER — Encounter: Payer: Self-pay | Admitting: Family Medicine

## 2016-12-09 NOTE — Telephone Encounter (Signed)
LMOVM for pt to return call 

## 2016-12-16 NOTE — Telephone Encounter (Signed)
Attempts made to contact patient.  Patient never returned calls

## 2016-12-19 NOTE — Telephone Encounter (Signed)
Patient advised as below-aa 

## 2017-07-02 ENCOUNTER — Encounter: Payer: Self-pay | Admitting: Family Medicine

## 2017-07-02 ENCOUNTER — Ambulatory Visit (INDEPENDENT_AMBULATORY_CARE_PROVIDER_SITE_OTHER): Payer: 59 | Admitting: Family Medicine

## 2017-07-02 VITALS — BP 128/70 | HR 74 | Temp 98.7°F | Resp 16 | Ht 66.25 in | Wt 210.0 lb

## 2017-07-02 DIAGNOSIS — Z01419 Encounter for gynecological examination (general) (routine) without abnormal findings: Secondary | ICD-10-CM

## 2017-07-02 DIAGNOSIS — Z23 Encounter for immunization: Secondary | ICD-10-CM | POA: Diagnosis not present

## 2017-07-02 DIAGNOSIS — Z124 Encounter for screening for malignant neoplasm of cervix: Secondary | ICD-10-CM | POA: Diagnosis not present

## 2017-07-02 DIAGNOSIS — Z8249 Family history of ischemic heart disease and other diseases of the circulatory system: Secondary | ICD-10-CM | POA: Diagnosis not present

## 2017-07-02 DIAGNOSIS — E010 Iodine-deficiency related diffuse (endemic) goiter: Secondary | ICD-10-CM | POA: Diagnosis not present

## 2017-07-02 NOTE — Progress Notes (Signed)
Patient: Tina Fuller, Female    DOB: 01-16-1961, 57 y.o.   MRN: [id number] Visit Date: 07/02/2017  Today's Provider: Lelon Huh, MD   Chief Complaint  Patient presents with  . Annual Exam  . Hyperlipidemia   Subjective:    Annual physical exam Tina Fuller is a 57 y.o. female who presents today for health maintenance and complete physical. She feels well. She reports no regular exercise. She reports she is sleeping fairly well.  -----------------------------------------------------------------  Lipid/Cholesterol, Follow-up:   Last seen for this1 years ago by her Cardiologist Dr Rockey Situ.  Management during that visit include encouraging continued exercise and careful diet management. . Last Lipid Panel:    Component Value Date/Time   CHOL 197 09/21/2014   TRIG 62 09/21/2014   HDL 88 (A) 09/21/2014   LDLCALC 97 09/21/2014    Risk factors for vascular disease include hypercholesterolemia  She reports poor compliance with treatment. She is not having side effects.  Current symptoms include none and have been stable. Weight trend: fluctuating a bit Prior visit with dietician: no Current diet: in general, an "unhealthy" diet Current exercise: none  Wt Readings from Last 3 Encounters:  04/30/16 213 lb 8 oz (96.8 kg)  08/22/15 209 lb (94.8 kg)  07/17/15 209 lb (94.8 kg)    -------------------------------------------------------------------   Review of Systems  Constitutional: Positive for activity change. Negative for chills, fatigue and fever.  HENT: Positive for sinus pressure and sneezing. Negative for congestion, ear pain, rhinorrhea and sore throat.   Eyes: Positive for photophobia, pain and visual disturbance. Negative for redness.  Respiratory: Negative for cough, shortness of breath and wheezing.   Cardiovascular: Positive for palpitations. Negative for chest pain and leg swelling.  Gastrointestinal: Positive for nausea. Negative for  abdominal pain, blood in stool, constipation and diarrhea.  Endocrine: Positive for polyphagia. Negative for polydipsia.  Genitourinary: Positive for vaginal discharge. Negative for dysuria, flank pain, hematuria, pelvic pain and vaginal bleeding.  Musculoskeletal: Positive for arthralgias, back pain, joint swelling, myalgias and neck pain. Negative for gait problem.  Skin: Positive for rash.  Neurological: Positive for light-headedness. Negative for dizziness, tremors, seizures, weakness, numbness and headaches.  Hematological: Negative for adenopathy. Bruises/bleeds easily.  Psychiatric/Behavioral: Negative.  Negative for behavioral problems, confusion and dysphoric mood. The patient is not nervous/anxious and is not hyperactive.     Social History      She  reports that she has quit smoking. She has a 2.00 pack-year smoking history. she has never used smokeless tobacco. She reports that she drinks alcohol. She reports that she does not use drugs.       Social History   Socioeconomic History  . Marital status: Divorced    Spouse name: None  . Number of children: 2  . Years of education: GED  . Highest education level: None  Social Needs  . Financial resource strain: None  . Food insecurity - worry: None  . Food insecurity - inability: None  . Transportation needs - medical: None  . Transportation needs - non-medical: None  Occupational History  . 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 Smoking status: Former Smoker    Packs/day: 0.25    Years: 8.00    Pack years: 2.00  . Smokeless tobacco: Never Used  . Tobacco comment: smoked less than 1/2 ppd for 5-10 years   Substance and Sexual Activity  .  Alcohol use: Yes    Comment: occasional   . Drug use: No  . Sexual activity: None  Other Topics Concern  . None  Social History Narrative   Divorced; full time; exercises regularly - walking     Past Medical History:   Diagnosis Date  . History of chicken pox   . Low back pain   . Palpitations   . Trochanteric bursitis of left hip   . Tubular adenoma of colon 08/2014     Patient Active Problem List   Diagnosis Date Noted  . Helicobacter pylori gastritis 08/22/2015  . 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 Low back pain radiating to left leg 05/21/2015  . Neuralgia neuritis, sciatic nerve 05/21/2015  . Trochanteric bursitis of left hip 05/21/2015  . Tubular adenoma of colon 05/21/2015  . Fatigue 05/21/2015  . Obesity 03/21/2015  . Big thyroid 02/05/2015  . Hyperlipidemia 12/05/2013  . Bulky or enlarged uterus 10/19/2012  . PALPITATIONS 01/25/2010  . SHORTNESS OF BREATH 01/25/2010    Past Surgical History:  Procedure Laterality Date  . beign breast biopsy    . fissurotomy      Family History        Family Status  Relation Name Status  . Father  Deceased at age 17       heart disease and CA  . Mother  Deceased at age 16       HTN; non-insulin dependent diabetes, alzheimers  . Brother  Alive       CHF and stroke  . Brother  Alive       CAD in 59s  . Sister  Alive       CAD with a stent at 43         Her family history includes Alzheimer's disease in her mother; Cerebrovascular Accident in her father and mother; Diabetes in her brother, mother, and sister; Heart disease in her brother, brother, and sister; Heart failure in her father; Hyperlipidemia in her brother, brother, father, and mother; Hypertension in her brother, brother, father, and mother.     No Known Allergies  No current outpatient medications on file.   Patient Care Team: Birdie Sons, MD as PCP - General (Family Medicine) Manus Rudd, NP as Nurse Practitioner (Nurse Practitioner) Minna Merritts, MD as Consulting Physician (Cardiology)      Objective:   Vitals: BP 128/70 (BP Location: Left Arm, Patient Position: Sitting, Cuff Size: Large)   Pulse 74   Temp 98.7 F (37.1 C) (Oral)   Resp 16   Ht 5'  6.25" (1.683 m)   Wt 210 lb (95.3 kg)   BMI 33.64 kg/m   There were no vitals filed for this visit.   Physical Exam   General Appearance:    Alert, cooperative, no distress, appears stated age  Head:    Normocephalic, without obvious abnormality, atraumatic  Eyes:    PERRL, conjunctiva/corneas clear, EOM's intact, fundi    benign, both eyes  Ears:    Normal TM's and external ear canals, both ears  Nose:   Nares normal, septum midline, mucosa normal, no drainage    or sinus tenderness  Throat:   Lips, mucosa, and tongue normal; teeth and gums normal  Neck:   Supple, symmetrical, trachea midline, no adenopathy;    thyroid:  no enlargement/tenderness/nodules; no carotid   bruit or JVD  Back:     Symmetric, no curvature, ROM normal, no CVA tenderness  Lungs:     Clear  to auscultation bilaterally, respirations unlabored  Chest Wall:    No tenderness or deformity   Heart:    Regular rate and rhythm, S1 and S2 normal, no murmur, rub   or gallop  Breast Exam:    normal appearance, no masses or tenderness  Abdomen:     Soft, non-tender, bowel sounds active all four quadrants,    no masses, no organomegaly  Pelvic:    cervix normal in appearance, external genitalia normal, no adnexal masses or tenderness and vagina normal without discharge  Extremities:   Extremities normal, atraumatic, no cyanosis or edema  Pulses:   2+ and symmetric all extremities  Skin:   Skin color, texture, turgor normal, no rashes or lesions  Lymph nodes:   Cervical, supraclavicular, and axillary nodes normal  Neurologic:   CNII-XII intact, normal strength, sensation and reflexes    throughout    Depression Screen PHQ 2/9 Scores 07/02/2017 07/17/2015  PHQ - 2 Score 0 3  PHQ- 9 Score 0 7      Assessment & Plan:     Routine Health Maintenance and Physical Exam  Exercise Activities and Dietary recommendations Goals    None      Immunization History  Administered Date(s) Administered  . Tdap  11/18/2006    Health Maintenance  Topic Date Due  . HIV Screening  04/09/1978  . TETANUS/TDAP  11/17/2016  . INFLUENZA VACCINE  01/14/2017  . MAMMOGRAM  02/21/2017  . PAP SMEAR  01/30/2018  . COLONOSCOPY  08/29/2019  . Hepatitis C Screening  Completed     Discussed health benefits of physical activity, and encouraged her to engage in regular exercise appropriate for her age and condition.    --------------------------------------------------------------------  1. Well woman exam with routine gynecological exam  - CBC - Lipid panel - Comprehensive metabolic panel - TSH  2. Need for shingles vaccine  - Varicella-zoster vaccine IM  3. Need for tetanus booster  - Td : Tetanus/diphtheria >7yo Preservative  free  4. Big thyroid  - TSH  5. Screening for cervical cancer  - Pap IG and HPV (high risk) DNA detection  Lelon Huh, MD  [place]

## 2017-07-03 LAB — LIPID PANEL
CHOL/HDL RATIO: 2.7 ratio (ref 0.0–4.4)
CHOLESTEROL TOTAL: 198 mg/dL (ref 100–199)
HDL: 74 mg/dL (ref >39)
LDL CALC: 110 mg/dL — AB (ref 0–99)
TRIGLYCERIDES: 68 mg/dL (ref 0–149)
VLDL CHOLESTEROL CAL: 14 mg/dL (ref 5–40)

## 2017-07-03 LAB — COMPREHENSIVE METABOLIC PANEL
A/G RATIO: 1.6 (ref 1.2–2.2)
ALT: 14 IU/L (ref 0–32)
AST: 19 IU/L (ref 0–40)
Albumin: 4.4 g/dL (ref 3.5–5.5)
Alkaline Phosphatase: 58 IU/L (ref 39–117)
BUN/Creatinine Ratio: 15 (ref 9–23)
BUN: 10 mg/dL (ref 6–24)
CO2: 20 mmol/L (ref 20–29)
Calcium: 9.6 mg/dL (ref 8.7–10.2)
Chloride: 105 mmol/L (ref 96–106)
Creatinine, Ser: 0.65 mg/dL (ref 0.57–1.00)
GFR calc Af Amer: 116 mL/min/{1.73_m2} (ref >59)
GFR, EST NON AFRICAN AMERICAN: 101 mL/min/{1.73_m2} (ref >59)
GLOBULIN, TOTAL: 2.8 g/dL (ref 1.5–4.5)
Glucose: 96 mg/dL (ref 65–99)
POTASSIUM: 4.5 mmol/L (ref 3.5–5.2)
Sodium: 139 mmol/L (ref 134–144)
Total Protein: 7.2 g/dL (ref 6.0–8.5)

## 2017-07-03 LAB — CBC
HEMATOCRIT: 36.3 % (ref 34.0–46.6)
Hemoglobin: 11.3 g/dL (ref 11.1–15.9)
MCH: 26.7 pg (ref 26.6–33.0)
MCHC: 31.1 g/dL — ABNORMAL LOW (ref 31.5–35.7)
MCV: 86 fL (ref 79–97)
PLATELETS: 398 10*3/uL — AB (ref 150–379)
RBC: 4.24 x10E6/uL (ref 3.77–5.28)
RDW: 16.3 % — ABNORMAL HIGH (ref 12.3–15.4)
WBC: 4.4 10*3/uL (ref 3.4–10.8)

## 2017-07-03 LAB — TSH: TSH: 0.657 u[IU]/mL (ref 0.450–4.500)

## 2017-07-04 LAB — PAP IG AND HPV HIGH-RISK
HPV, high-risk: NEGATIVE
PAP Smear Comment: 0

## 2017-07-23 LAB — HM MAMMOGRAPHY

## 2017-08-04 ENCOUNTER — Encounter: Payer: Self-pay | Admitting: *Deleted

## 2017-08-18 NOTE — Progress Notes (Signed)
Cardiology Office Note  Date:  08/19/2017   ID:  Tina Fuller, Tina Fuller March 29, 1961, MRN [id number]  PCP:  Birdie Sons, MD   Chief Complaint  Patient presents with  . OTHER    LS 2017 12 month f/u c/o palpitations, bilateral leg pain and neck/left shoulder arm pain.  Meds reveiwed verbally with pt.    HPI:  57 year old woman with  obesity,   no known cardiac history,  family history of diabetes and heart disease  seen in 2011 for tachycardia, chest tightness.  She presents for routine followup of her blood pressure, risk factors tachycardia,, leg pain, arm pain  On her last clinic visit over one year ago she reported having leg pain She had good pulses at the time, it was felt discomfort was not from claudication  Still with leg pain, night Some in the day,  Worse after she has been on her feet all day Periodic cramps in her legs worse at night  She reports having pain in her posterior shoulders, back of the neck, sometimes down her left arm Discomfort on palpation, sometimes with movement Works in a meat packing, processing area all day, lots of repetitive motion Works at [employer] lab work reviewed with her showing total cholesterol around 200, LDL 110  Blood pressure elevated on initial arrival, improved down to 120/65 on my recheck   She used to smoke but stopped approximately 15 years ago.  EKG personally reviewed by myself on todays visit Shows normal sinus rhythm rate 74 bpm no significant ST or T wave changes   PMH:   has a past medical history of History of chicken pox, Low back pain, Palpitations, Trochanteric bursitis of left hip, and Tubular adenoma of colon (08/2014).  PSH:    Past Surgical History:  Procedure Laterality Date  . beign breast biopsy    . fissurotomy      No current outpatient medications on file.   No current facility-administered medications for this visit.      Allergies:   Patient has no known allergies.    Social History:  The patient  reports that she has quit smoking. She has a 2.00 pack-year smoking history. she has never used smokeless tobacco. She reports that she drinks alcohol. She reports that she does not use drugs.   Family History:   family history includes Alzheimer's disease in her mother; Cerebrovascular Accident in her father and mother; Diabetes in her brother, mother, and sister; Heart disease in her brother, brother, and sister; Heart failure in her father; Hyperlipidemia in her brother, brother, father, and mother; Hypertension in her brother, brother, father, and mother.    Review of Systems: Review of Systems  Constitutional: Negative.   Respiratory: Negative.   Cardiovascular: Negative.   Gastrointestinal: Negative.   Musculoskeletal: Positive for myalgias.       Leg pain, posterior shoulders, back of the head  Neurological: Negative.   Psychiatric/Behavioral: Negative.   All other systems reviewed and are negative.    PHYSICAL EXAM: VS:  BP (!) 158/80 (BP Location: Left Arm, Patient Position: Sitting, Cuff Size: Normal)   Pulse 74   Ht 5\' 8"  (1.727 m)   Wt 214 lb 8 oz (97.3 kg)   BMI 32.61 kg/m  , BMI Body mass index is 32.61 kg/m. GEN: Well nourished, well developed, in no acute distress  HEENT: normal  Neck: no JVD, carotid bruits, or masses Cardiac: RRR; no murmurs, rubs, or gallops,no edema  Respiratory:  clear to auscultation bilaterally, normal work of breathing GI: soft, nontender, nondistended, + BS MS: no deformity or atrophy  Skin: warm and dry, no rash Neuro:  Strength and sensation are intact Psych: euthymic mood, full affect    Recent Labs: 07/02/2017: ALT 14; BUN 10; Creatinine, Ser 0.65; Hemoglobin 11.3; Platelets 398; Potassium 4.5; Sodium 139; TSH 0.657    Lipid Panel Lab Results  Component Value Date   CHOL 198 07/02/2017   HDL 74 07/02/2017   LDLCALC 110 (H) 07/02/2017   TRIG 68 07/02/2017      Wt Readings from Last 3  Encounters:  08/19/17 214 lb 8 oz (97.3 kg)  07/02/17 210 lb (95.3 kg)  04/30/16 213 lb 8 oz (96.8 kg)       ASSESSMENT AND PLAN:  Palpitations - Plan: EKG 12-Lead Denies having any palpitations, improved since her last clinic visit No further workup  Pure hypercholesterolemia Reasonable cholesterol numbers, No other risk factors.  Recommended weight loss No strong indication for statin at this time  Shortness of breath Recommended regular exercise program for weight loss and conditioning Reports that she recently joined the [facility]  Paravertebral muscle spasms She was concerned this was cardiac related Recommended she monitor her posture, by different pillow, try massage, Talk with primary care about muscle relaxer medications, NSAIDs  Left leg pain Likely musculoskeletal, She has good extremity/distal pulses, no PAD suspected Recommended she try compression hose when she is working, as she spends long hours on her feet   Total encounter time more than 25 minutes  Greater than 50% was spent in counseling and coordination of care with the patient   Disposition:   F/U  12 months as needed    No orders of the defined types were placed in this encounter.    Signed, Esmond Plants, M.D., Ph.D. 08/19/2017  [place], [place]

## 2017-08-19 ENCOUNTER — Encounter: Payer: Self-pay | Admitting: Cardiovascular Disease

## 2017-08-19 ENCOUNTER — Ambulatory Visit (INDEPENDENT_AMBULATORY_CARE_PROVIDER_SITE_OTHER): Payer: 59 | Admitting: Cardiovascular Disease

## 2017-08-19 VITALS — BP 120/65 | HR 74 | Ht 68.0 in | Wt 214.5 lb

## 2017-08-19 DIAGNOSIS — M79605 Pain in left leg: Secondary | ICD-10-CM

## 2017-08-19 DIAGNOSIS — E7800 Pure hypercholesterolemia, unspecified: Secondary | ICD-10-CM

## 2017-08-19 DIAGNOSIS — R002 Palpitations: Secondary | ICD-10-CM

## 2017-08-19 DIAGNOSIS — Z6831 Body mass index (BMI) 31.0-31.9, adult: Secondary | ICD-10-CM

## 2017-08-19 DIAGNOSIS — R0602 Shortness of breath: Secondary | ICD-10-CM | POA: Diagnosis not present

## 2017-08-19 DIAGNOSIS — E6609 Other obesity due to excess calories: Secondary | ICD-10-CM | POA: Diagnosis not present

## 2017-08-19 DIAGNOSIS — M79604 Pain in right leg: Secondary | ICD-10-CM | POA: Diagnosis not present

## 2017-08-19 DIAGNOSIS — M79606 Pain in leg, unspecified: Secondary | ICD-10-CM | POA: Insufficient documentation

## 2017-08-19 DIAGNOSIS — M62838 Other muscle spasm: Secondary | ICD-10-CM

## 2017-08-19 HISTORY — DX: Other muscle spasm: M62.838

## 2017-08-19 NOTE — Patient Instructions (Signed)
Research paravertebral muscle spasm -  In the neck and arms  Compression hose (TED hose) 10 to 20 mm Hg  Medication Instructions:   No medication changes made  Labwork:  No new labs needed  Testing/Procedures:  No further testing at this time    Follow-Up: It was a pleasure seeing you in the office today. Please call us if you have new issues that need to be addressed before your next appt.  [phone]  Your physician wants you to follow-up in: 12 months as needed.  You will receive a reminder letter in the mail two months in advance. If you don't receive a letter, please call our office to schedule the follow-up appointment.  If you need a refill on your cardiac medications before your next appointment, please call your pharmacy.  For educational health videos Log in to : [email or web address] Or : [email or web address], password : [email or web address]

## 2017-08-31 ENCOUNTER — Ambulatory Visit: Payer: 59 | Admitting: Family Medicine

## 2017-09-02 ENCOUNTER — Ambulatory Visit: Payer: Self-pay | Admitting: Family Medicine

## 2017-09-14 ENCOUNTER — Ambulatory Visit (INDEPENDENT_AMBULATORY_CARE_PROVIDER_SITE_OTHER): Payer: 59 | Admitting: Family Medicine

## 2017-09-14 DIAGNOSIS — Z23 Encounter for immunization: Secondary | ICD-10-CM | POA: Diagnosis not present

## 2017-12-10 ENCOUNTER — Ambulatory Visit: Payer: Self-pay | Admitting: Family Medicine

## 2017-12-31 NOTE — Progress Notes (Signed)
Patient: Tina Fuller Female    DOB: 1960-10-17   57 y.o.   MRN: [id number] Visit Date: 01/01/2018  Today's Provider: Lelon Huh, MD   Chief Complaint  Patient presents with  . Sinusitis  . Mass   Subjective:    Sinusitis  This is a new problem. The current episode started more than 1 month ago. The problem is unchanged. There has been no fever. The pain is mild. Associated symptoms include headaches and sinus pressure. Pertinent negatives include no chills, congestion, coughing, diaphoresis, ear pain, hoarse voice, shortness of breath, sneezing, sore throat or swollen glands. Treatments tried: Sinus medications otc. The treatment provided mild relief.    Patient states she has had sinus pressure and pain for several months. Patient has been treating symptoms with otc medications but they only help for a few hours at a time. Other symptoms include ear congestion, headaches, and runny nose.   Patient also has a small mass on her left side. Patient states mass has been there for years but has grown and has become sensitive. Has several others across her trunk, but on left flank it sore and getting caught on bra strap.    No Known Allergies   Current Outpatient Medications:  .  Naproxen Sodium (ALEVE PO), Take by mouth as needed., Disp: , Rfl:   Review of Systems  Constitutional: Negative for appetite change, chills, diaphoresis, fatigue and fever.  HENT: Positive for postnasal drip, rhinorrhea, sinus pressure and sinus pain. Negative for congestion, ear pain, hoarse voice, sneezing and sore throat.   Respiratory: Negative for cough, chest tightness and shortness of breath.   Cardiovascular: Negative for chest pain and palpitations.  Gastrointestinal: Negative for abdominal pain, nausea and vomiting.  Neurological: Positive for headaches. Negative for dizziness and weakness.    Social History   Tobacco Use  . Smoking status: Former Smoker    Packs/day: 0.25   Years: 8.00    Pack years: 2.00  . Smokeless tobacco: Never Used  . Tobacco comment: smoked less than 1/2 ppd for 5-10 years   Substance Use Topics  . Alcohol use: Yes    Comment: occasional    Objective:   BP 110/60 (BP Location: Right Arm, Patient Position: Sitting, Cuff Size: Large)   Pulse 67   Temp 98.4 F (36.9 C) (Oral)   Resp 16   Ht 5\' 8"  (1.727 m)   Wt 211 lb (95.7 kg)   SpO2 98%   BMI 32.08 kg/m  Vitals:   01/01/18 0858  BP: 110/60  Pulse: 67  Resp: 16  Temp: 98.4 F (36.9 C)  TempSrc: Oral  SpO2: 98%  Weight: 211 lb (95.7 kg)  Height: 5\' 8"  (1.727 m)     Physical Exam  General Appearance:    Alert, cooperative, no distress  HENT:   bilateral TM normal without fluid or infection, neck without nodes, maxillary sinuses tender and nasal mucosa pale and congested  Eyes:    PERRL, conjunctiva/corneas clear, EOM's intact       Lungs:     Clear to auscultation bilaterally, respirations unlabored  Heart:    Regular rate and rhythm  Derm:   Soft fatty mass left flank, c/w lipoma.            Assessment & Plan:     1. Congestion of paranasal sinus  - amoxicillin (AMOXIL) 500 MG capsule; Take 2 capsules (1,000 mg total) by mouth 2 (two) times daily for  7 days.  Dispense: 28 capsule; Refill: 0 - fluticasone (FLONASE) 50 MCG/ACT nasal spray; Place 2 sprays into both nostrils daily.  Dispense: 16 g; Refill: 6  2. Left flank mass Likely lipoma. It has gotten bigger and is now catching on brastraps and she would like to have evaluation for possible excision.  - Ambulatory referral to [place], MD  [place]

## 2018-01-01 ENCOUNTER — Ambulatory Visit (INDEPENDENT_AMBULATORY_CARE_PROVIDER_SITE_OTHER): Payer: 59 | Admitting: Family Medicine

## 2018-01-01 ENCOUNTER — Encounter: Payer: Self-pay | Admitting: Family Medicine

## 2018-01-01 VITALS — BP 110/60 | HR 67 | Temp 98.4°F | Resp 16 | Ht 68.0 in | Wt 211.0 lb

## 2018-01-01 DIAGNOSIS — R1900 Intra-abdominal and pelvic swelling, mass and lump, unspecified site: Secondary | ICD-10-CM | POA: Diagnosis not present

## 2018-01-01 DIAGNOSIS — R0981 Nasal congestion: Secondary | ICD-10-CM

## 2018-01-01 MED ORDER — FLUTICASONE PROPIONATE 50 MCG/ACT NA SUSP: 2.0000 | Freq: Every day | NASAL | 6 refills | Status: DC

## 2018-01-01 MED ORDER — AMOXICILLIN 500 MG PO CAPS: 1000.0000 mg | ORAL_CAPSULE | Freq: Two times a day (BID) | ORAL | 0 refills | Status: AC

## 2018-01-14 ENCOUNTER — Other Ambulatory Visit: Payer: Self-pay

## 2018-01-19 ENCOUNTER — Ambulatory Visit (INDEPENDENT_AMBULATORY_CARE_PROVIDER_SITE_OTHER): Payer: 59 | Admitting: Surgery

## 2018-01-19 ENCOUNTER — Encounter: Payer: Self-pay | Admitting: Surgery

## 2018-01-19 VITALS — BP 173/93 | HR 71 | Temp 98.0°F | Wt 211.2 lb

## 2018-01-19 DIAGNOSIS — D171 Benign lipomatous neoplasm of skin and subcutaneous tissue of trunk: Secondary | ICD-10-CM

## 2018-01-19 NOTE — Patient Instructions (Signed)
Please remember not to lift more than 15 lbs for two weeks (follow up appointment).  Lipoma A lipoma is a noncancerous (benign) tumor that is made up of fat cells. This is a very common type of soft-tissue growth. Lipomas are usually found under the skin (subcutaneous). They may occur in any tissue of the body that contains fat. Common areas for lipomas to appear include the back, shoulders, buttocks, and thighs. Lipomas grow slowly, and they are usually painless. Most lipomas do not cause problems and do not require treatment. What are the causes? The cause of this condition is not known. What increases the risk? This condition is more likely to develop in:  People who are 44-26 years old.  People who have a family history of lipomas.  What are the signs or symptoms? A lipoma usually appears as a small, round bump under the skin. It may feel soft or rubbery, but the firmness can vary. Most lipomas are not painful. However, a lipoma may become painful if it is located in an area where it pushes on nerves. How is this diagnosed? A lipoma can usually be diagnosed with a physical exam. You may also have tests to confirm the diagnosis and to rule out other conditions. Tests may include:  Imaging tests, such as a CT scan or MRI.  Removal of a tissue sample to be looked at under a microscope (biopsy).  How is this treated? Treatment is not needed for small lipomas that are not causing problems. If a lipoma continues to get bigger or it causes problems, removal is often the best option. Lipomas can also be removed to improve appearance. Removal of a lipoma is usually done with a surgery in which the fatty cells and the surrounding capsule are removed. Most often, a medicine that numbs the area (local anesthetic) is used for this procedure. Follow these instructions at home:  Keep all follow-up visits as directed by your health care provider. This is important. Contact a health care provider  if:  Your lipoma becomes larger or hard.  Your lipoma becomes painful, red, or increasingly swollen. These could be signs of infection or a more serious condition. This information is not intended to replace advice given to you by your health care provider. Make sure you discuss any questions you have with your health care provider. Document Released: 05/23/2002 Document Revised: 11/08/2015 Document Reviewed: 05/29/2014 Elsevier Interactive Patient Education  2018 [employer].    Lipoma Removal, Care After Refer to this sheet in the next few weeks. These instructions provide you with information about caring for yourself after your procedure. Your health care provider may also give you more specific instructions. Your treatment has been planned according to current medical practices, but problems sometimes occur. Call your health care provider if you have any problems or questions after your procedure. What can I expect after the procedure? After the procedure, it is common to have:  Mild pain.  Swelling.  Bruising.  Follow these instructions at home:  Bathing  Do not take baths, swim, or use a hot tub until your health care provider approves. Ask your health care provider if you can take showers. You may only be allowed to take sponge baths for bathing.  Keep your bandage (dressing) dry until your health care provider says it can be removed. Incision care   Follow instructions from your health care provider about how to take care of your incision. Make sure you: ? Wash your hands with soap  and water before you change your bandage (dressing). If soap and water are not available, use hand sanitizer. ? Change your dressing as told by your health care provider. ? Leave stitches (sutures), skin glue, or adhesive strips in place. These skin closures may need to stay in place for 2 weeks or longer. If adhesive strip edges start to loosen and curl up, you may trim the loose edges. Do  not remove adhesive strips completely unless your health care provider tells you to do that.  Check your incision area every day for signs of infection. Check for: ? More redness, swelling, or pain. ? Fluid or blood. ? Warmth. ? Pus or a bad smell. Driving  Do not drive or operate heavy machinery while taking prescription pain medicine.  Do not drive for 24 hours if you received a medicine to help you relax (sedative) during your procedure.  Ask your health care provider when it is safe for you to drive. General instructions  Take over-the-counter and prescription medicines only as told by your health care provider.  Do not use any tobacco products, such as cigarettes, chewing tobacco, and e-cigarettes. These can delay healing. If you need help quitting, ask your health care provider.  Return to your normal activities as told by your health care provider. Ask your health care provider what activities are safe for you.  Keep all follow-up visits as told by your health care provider. This is important. Contact a health care provider if:  You have more redness, swelling, or pain around your incision.  You have fluid or blood coming from your incision.  Your incision feels warm to the touch.  You have pus or a bad smell coming from your incision.  You have pain that does not get better with medicine. Get help right away if:  You have chills or a fever.  You have severe pain. This information is not intended to replace advice given to you by your health care provider. Make sure you discuss any questions you have with your health care provider. Document Released: 08/16/2015 Document Revised: 11/13/2015 Document Reviewed: 08/16/2015 Elsevier Interactive Patient Education  2018 [employer].

## 2018-01-19 NOTE — Progress Notes (Signed)
Surgical Clinic History and Physical  Referring provider:  Birdie Sons, MD [address] [place], [place] [address]  HISTORY OF PRESENT ILLNESS (HPI):  57 y.o. female presents for evaluation of her Left flank lipoma. Patient reports she's had subcutaneous masses of her Left flank, Right lower back and LLQ abdomen for several years, but while all 3 masses have continued to enlarge, only over the past few months has her Left flank mass began to hurt as her bra strap frequently rubs and applies pressure to the mass. She otherwise denies any prior infection or surgeries for any of her masses.  PAST MEDICAL HISTORY (PMH):  Past Medical History:  Diagnosis Date  . History of chicken pox   . Low back pain   . Palpitations   . Trochanteric bursitis of left hip   . Tubular adenoma of colon 08/2014     PAST SURGICAL HISTORY [facility]):  Past Surgical History:  Procedure Laterality Date  . beign breast biopsy    . fissurotomy       MEDICATIONS:  Prior to Admission medications   Not on File     ALLERGIES:  No Known Allergies   SOCIAL HISTORY:  Social History   Socioeconomic History  . Marital status: Divorced    Spouse name: Not on file  . Number of children: 2  . Years of education: GED  . Highest education level: Not on file  Occupational History  . 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 Financial resource strain: Not on file  . Food insecurity:    Worry: Not on file    Inability: Not on file  . Transportation needs:    Medical: Not on file    Non-medical: Not on file  Tobacco Use  . Smoking status: Former Smoker    Packs/day: 0.25    Years: 8.00    Pack years: 2.00  . Smokeless tobacco: Never Used  . Tobacco comment: smoked less than 1/2 ppd for 5-10 years   Substance and Sexual Activity  . Alcohol use: Yes    Comment: occasional   . Drug use: No  . Sexual activity: Not on file   Lifestyle  . Physical activity:    Days per week: Not on file    Minutes per session: Not on file  . Stress: Not on file  Relationships  . Social connections:    Talks on phone: Not on file    Gets together: Not on file    Attends religious service: Not on file    Active member of club or organization: Not on file    Attends meetings of clubs or organizations: Not on file    Relationship status: Not on file  . Intimate partner violence:    Fear of current or ex partner: Not on file    Emotionally abused: Not on file    Physically abused: Not on file    Forced sexual activity: Not on file  Other Topics Concern  . Not on file  Social History Narrative   Divorced; full time; exercises regularly - walking     The patient currently resides (home / rehab facility / nursing home): Home The patient normally is (ambulatory / bedbound): Ambulatory  FAMILY HISTORY:  Family History  Problem Relation Age of Onset  . Heart failure Father   . Hyperlipidemia Father   . Hypertension Father   . Cerebrovascular  Accident Father   . Hypertension Mother   . Hyperlipidemia Mother   . Diabetes Mother   . Alzheimer's disease Mother   . Cerebrovascular Accident Mother   . Heart disease Brother   . Hypertension Brother   . Hyperlipidemia Brother   . Diabetes Brother   . Heart disease Brother   . Hyperlipidemia Brother   . Hypertension Brother   . Heart disease Sister   . Diabetes Sister     Otherwise negative/non-contributory.  REVIEW OF SYSTEMS:  Constitutional: denies any other weight loss, fever, chills, or sweats  Eyes: denies any other vision changes, history of eye injury  ENT: denies sore throat, hearing problems  Respiratory: denies shortness of breath, wheezing  Cardiovascular: denies chest pain, palpitations  Gastrointestinal: denies abdominal pain, N/V, or diarrhea Musculoskeletal: denies any other joint pains or cramps  Skin: Denies any other rashes or skin discolorations  except as per HPI Neurological: denies any other headache, dizziness, weakness  Psychiatric: Denies any other depression, anxiety   All other review of systems were otherwise negative   VITAL SIGNS:  BP (!) 173/93   Pulse 71   Temp 98 F (36.7 C) (Oral)   Wt 211 lb 3.2 oz (95.8 kg)   BMI 32.11 kg/m   PHYSICAL EXAM:  Constitutional:  -- Overweight body habitus  -- Awake, alert, and oriented x3  Eyes:  -- Pupils equally round and reactive to light  -- No scleral icterus  Ear, nose, throat:  -- No jugular venous distension -- No nasal drainage, bleeding Pulmonary:  -- No crackles  -- Equal breath sounds bilaterally -- Breathing non-labored at rest Cardiovascular:  -- S1, S2 present  -- No pericardial rubs  Gastrointestinal:  -- Abdomen soft, nontender, non-distended, no guarding/rebound  -- No abdominal masses appreciated, pulsatile or otherwise  Musculoskeletal and Integumentary:  -- Wounds or skin discoloration: 3 cm Left flank subcutaneous mass, 4.5 cm Right lower back mass, and 2 cm LLQ abdominal mass, all three being mobile and spongy with only the Left flank mass being tender to palpation -- Extremities: B/L UE and LE FROM, hands and feet warm, no edema  Neurologic:  -- Motor function: Intact and symmetric -- Sensation: Intact and symmetric  Labs:  CBC Latest Ref Rng & Units 07/02/2017 07/17/2015  WBC 3.4 - 10.8 x10E3/uL 4.4 5.1  Hemoglobin 11.1 - 15.9 g/dL 11.3 12.3  Hematocrit 34.0 - 46.6 % 36.3 36.6  Platelets 150 - 379 x10E3/uL 398(H) 371   CMP Latest Ref Rng & Units 07/02/2017 07/17/2015 09/21/2014  Glucose 65 - 99 mg/dL 96 86 -  BUN 6 - 24 mg/dL 10 14 10   Creatinine 0.57 - 1.00 mg/dL 0.65 0.97 0.6  Sodium 134 - 144 mmol/L 139 139 139  Potassium 3.5 - 5.2 mmol/L 4.5 4.7 4.5  Chloride 96 - 106 mmol/L 105 102 -  CO2 20 - 29 mmol/L 20 23 -  Calcium 8.7 - 10.2 mg/dL 9.6 9.4 -  Total Protein 6.0 - 8.5 g/dL 7.2 7.2 -  Total Bilirubin 0.0 - 1.2 mg/dL <0.2 <0.2 -   Alkaline Phos 39 - 117 IU/L 58 59 -  AST 0 - 40 IU/L 19 15 16   ALT 0 - 32 IU/L 14 10 9     Imaging studies: No new pertinent imaging studies available for review   Assessment/Plan:  57 y.o. female with a 3+ cm increasingly symptomatic and enlarging Left flank subcutaneous mass and asymptomatic also-enlarged Right mid-back and LLQ abdominal subcutaneous masses,  all most likely consistent with lipomas, complicated by co-morbidities including primarily obesity (BMI >32).   - differential diagnoses for subcutaneous masses discussed   - all risks, benefits, and alternatives to in-office excision of increasingly symptomatic and enlarged Left flank subcutaneous mass were discussed with the patient, all of her questions were answered to her expressed satisfaction, patient expresses she wishes to proceed, and informed consent was obtained.   - will plan for elective in-office excision of increasingly symptomatic enlarged Left flank subcutaneous mass on Tuesday, 8/20  - anticipate return to clinic 2 weeks following above planned procedure  - instructed to call if any questions or concerns  All of the above recommendations were discussed with the patient, and all of patient's questions were answered to her expressed satisfaction.  Thank you for the opportunity to participate in this patient's care.  -- [name] Rosana Hoes, MD, [place]: [facility] - Partnering for exceptional care. Office: [phone]

## 2018-02-02 ENCOUNTER — Ambulatory Visit: Payer: 59 | Admitting: Surgery

## 2018-02-02 ENCOUNTER — Encounter: Payer: Self-pay | Admitting: Surgery

## 2018-02-02 VITALS — BP 157/88 | HR 69 | Temp 97.7°F | Wt 213.0 lb

## 2018-02-02 DIAGNOSIS — D213 Benign neoplasm of connective and other soft tissue of thorax: Secondary | ICD-10-CM

## 2018-02-02 DIAGNOSIS — D171 Benign lipomatous neoplasm of skin and subcutaneous tissue of trunk: Secondary | ICD-10-CM

## 2018-02-02 NOTE — Procedures (Signed)
SURGICAL OPERATIVE REPORT  DATE OF PROCEDURE: 02/02/2018  ATTENDING: Corene Cornea E. 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 (icd-10: D17.1)   PROCEDURE(S):  1.) Excision of increasingly symptomatic (painful) lipoma of the Left flank (cpt: 21931)   INTRAOPERATIVE FINDINGS: 3.5 cm x 3 cm x 2.5 cm non-infected subcutaneous mass of the Left flank (most likely a lipoma), removed intact  INTRAVENOUS FLUIDS: 0 mL crystalloid   ESTIMATED BLOOD LOSS: Minimal (< 20 mL)  URINE OUTPUT: No Foley  SPECIMENS: 3.5 cm x 3 cm x 2.5 cm non-infected subcutaneous mass of the Left flank (most likely a lipoma), removed intact  IMPLANTS: None  DRAINS: None  COMPLICATIONS: None apparent  CONDITION AT END OF PROCEDURE: Hemodynamically stable and awake  DISPOSITION OF PATIENT: PACU  INDICATIONS FOR PROCEDURE:  Patient is a 57 y.o. female who presented for an increasingly symptomatic (painful) subcutaneous mass of the Left flank without prior infection, at which time patient was told it was a lipoma. Patient reports it's been present/growing for years and recently has become increasingly painful due in part to its location. Patient requested that it be removed so activities can be performed with less discomfort, and patient was accordingly referred for surgical evaluation and management. All risks, benefits, and alternatives to above procedure were discussed with the patient, all of patient's questions were answered to his expressed satisfaction, and informed consent was obtained and documented.   DETAILS OF PROCEDURE: Patient was brought to the procedure suite and was appropriately identified. Laying on the procedural table with her Right side down, operative site was prepped and  draped in the usual sterile fashion, and following a brief time out, a 3 cm long and 1.5 cm wide transverse elliptical incision was made using a #15 blade scalpel, and incision was extended deep around subcutaneous mass to fascia using sharp + blunt dissection. During the course of dissecting free the lipoma, it was not disrupted and was removed intact. Direct pressure was applied for hemostasis. Further examination did not reveal any additional lipoma. Hemostasis was achieved, and the wound was copiously irrigated with unused local anesthetic. Deep tissue and dermis were re-approximated using buried interrupted 3-0 Vicryl suture, and running subcuticular 4-0 Monocryl suture was used to re-approximate epidermis. Skin was then cleaned and dried, and sterile Dermabond skin glue was applied to the wound. Patient was then safely transferred to recovery for post-procedural monitoring and care.  I was present for all aspects of the above procedure, and no operative complications were apparent.

## 2018-02-02 NOTE — Patient Instructions (Addendum)
Please do not shower for the next 48 hours. Then you are able to shower but no baths.   If you start to have pain, you are able to take Ibuprofen 800 MG every 8 hours as needed or Aleve once a day.   Please give [place] a call in case you have any questions or concerns.   We will see you back in two weeks to make sure that you are doing well.

## 2018-02-16 ENCOUNTER — Ambulatory Visit (INDEPENDENT_AMBULATORY_CARE_PROVIDER_SITE_OTHER): Payer: 59 | Admitting: Surgery

## 2018-02-16 ENCOUNTER — Encounter: Payer: Self-pay | Admitting: Surgery

## 2018-02-16 VITALS — BP 132/80 | HR 80 | Temp 98.6°F | Resp 14 | Ht 68.0 in | Wt 215.0 lb

## 2018-02-16 DIAGNOSIS — Z4889 Encounter for other specified surgical aftercare: Secondary | ICD-10-CM

## 2018-02-16 NOTE — Progress Notes (Signed)
Surgical Clinic Progress/Follow-up Note   HPI:  57 y.o. Female presents to clinic for post-op follow-up 2 weeks s/p excision of increasingly symptomatic Left flank lipoma Tina Fuller, 02/02/2018). Patient reports she has been very happy with her results and describes complete resolution of pre-operative discomfort, has been tolerating regular diet with +flatus and normal BM's, denies N/V, fever/chills, CP, or SOB.  Review of Systems:  Constitutional: denies fever/chills  Respiratory: denies shortness of breath, wheezing  Cardiovascular: denies chest pain, palpitations  Gastrointestinal: denies abdominal pain, N/V, or diarrhea/and bowel function as per interval history Skin: Denies any other rashes or skin discolorations except post-surgical wounds as per interval history  Vital Signs:  BP 132/80   Pulse 80   Temp 98.6 F (37 C)   Resp 14   Ht 5\' 8"  (1.727 m)   Wt 215 lb (97.5 kg)   LMP 01/24/2018   BMI 32.69 kg/m    Physical Exam:  Constitutional:  -- Overweight body habitus  -- Awake, alert, and oriented x3  Pulmonary:  -- No crackles -- Equal breath sounds bilaterally -- Breathing non-labored at rest Cardiovascular:  -- S1, S2 present  -- No pericardial rubs  Gastrointestinal:  -- Soft and non-distended, non-tender to palpation, no guarding/rebound tenderness -- No abdominal masses appreciated, pulsatile or otherwise  Musculoskeletal / Integumentary:  -- Wounds or skin discoloration: Post-surgical incision well-approximated without any peri-incisional erythema or drainage -- Extremities: B/L UE and LE FROM, hands and feet warm, no edema   Assessment:  57 y.o. yo Female with a problem list including...  Patient Active Problem List   Diagnosis Date Noted  . Muscle spasms of neck 08/19/2017  . Leg pain 08/19/2017  . Family history of premature CAD 07/02/2017  . Helicobacter pylori gastritis 08/22/2015  . 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 Low back pain radiating to left  leg 05/21/2015  . Neuralgia neuritis, sciatic nerve 05/21/2015  . Trochanteric bursitis of left hip 05/21/2015  . Tubular adenoma of colon 05/21/2015  . Fatigue 05/21/2015  . Obesity 03/21/2015  . Big thyroid 02/05/2015  . Hyperlipidemia 12/05/2013  . Bulky or enlarged uterus 10/19/2012  . PALPITATIONS 01/25/2010  . SHORTNESS OF BREATH 01/25/2010    presents to clinic for post-op follow-up evaluation, doing well 2 weeks s/p excision of increasingly symptomatic Left flank lipoma Tina Fuller, 02/02/2018).  Plan:              - pathology results discussed             - okay to submerge incisions under water (baths, swimming) prn             - gradually resume all activities without restrictions over next 2 weeks             - apply sunblock particularly to incisions with sun exposure to reduce pigmentation of scars             - return to clinic as needed, instructed to call office if any questions or concerns  All of the above recommendations were discussed with the patient, and all of patient's questions were answered to her expressed satisfaction.  -- [name] Tina Hoes, MD, [place]: [facility] - Partnering for exceptional care. Office: [phone]

## 2018-02-16 NOTE — Patient Instructions (Signed)
Follow up as needed

## 2018-02-17 ENCOUNTER — Encounter: Payer: Self-pay | Admitting: Surgery

## 2018-04-23 ENCOUNTER — Ambulatory Visit: Payer: Self-pay | Admitting: Family Medicine

## 2018-04-27 ENCOUNTER — Ambulatory Visit (INDEPENDENT_AMBULATORY_CARE_PROVIDER_SITE_OTHER): Payer: 59 | Admitting: Family Medicine

## 2018-04-27 ENCOUNTER — Encounter: Payer: Self-pay | Admitting: Family Medicine

## 2018-04-27 VITALS — BP 138/72 | HR 77 | Temp 98.0°F | Resp 16 | Wt 212.0 lb

## 2018-04-27 DIAGNOSIS — R21 Rash and other nonspecific skin eruption: Secondary | ICD-10-CM

## 2018-04-27 MED ORDER — METRONIDAZOLE 1 % EX GEL: Freq: Two times a day (BID) | CUTANEOUS | 1 refills | Status: DC | PRN

## 2018-04-27 NOTE — Progress Notes (Signed)
       Patient: Tina Fuller Female    DOB: 01/19/1961   57 y.o.   MRN: [id number] Visit Date: 04/27/2018  Today's Provider: Lelon Huh, MD   Chief Complaint  Patient presents with  . Rash   Subjective:    Rash  This is a recurrent (first noticed 6 months ago) problem. The problem has been waxing and waning (occurs interminttenty) since onset. The affected locations include the face and neck. The rash is characterized by itchiness and redness. Pertinent negatives include no fatigue, fever, shortness of breath or vomiting. Treatments tried: OTC cortisone cream. The treatment provided mild relief.  she states she has broke out three times in the last few months. Rash is red and itchy, and lasts about 3 weeks. No related to heat. No relief with benadryl. No stinging or burning. Occurs across the front of her neck with some reaction around cheeks. No new medications, dietary changes, soaps or detergents. Rash is mostly resolved today.     No Known Allergies  No current outpatient medications on file.  Review of Systems  Constitutional: Negative for appetite change, chills, fatigue and fever.  Respiratory: Negative for chest tightness and shortness of breath.   Cardiovascular: Negative for chest pain and palpitations.  Gastrointestinal: Negative for abdominal pain, nausea and vomiting.  Skin: Positive for rash.  Neurological: Negative for dizziness and weakness.    Social History   Tobacco Use  . Smoking status: Former Smoker    Packs/day: 0.25    Years: 8.00    Pack years: 2.00  . Smokeless tobacco: Never Used  . Tobacco comment: smoked less than 1/2 ppd for 5-10 years   Substance Use Topics  . Alcohol use: Yes    Comment: occasional    Objective:   BP 138/72 (BP Location: Left Arm, Patient Position: Sitting, Cuff Size: Large)   Pulse 77   Temp 98 F (36.7 C) (Oral)   Resp 16   Wt 212 lb (96.2 kg)   SpO2 97% Comment: room air  BMI 32.23 kg/m  Vitals:   04/27/18 1455  BP: 138/72  Pulse: 77  Resp: 16  Temp: 98 F (36.7 C)  TempSrc: Oral  SpO2: 97%  Weight: 212 lb (96.2 kg)     Physical Exam  General appearance: alert, well developed, well nourished, cooperative and in no distress Head: Normocephalic, without obvious abnormality, atraumatic Respiratory: Respirations even and unlabored, normal respiratory rate Extremities: No gross deformities Skin: Skin color, texture, turgor normal. No rashes seen      Assessment & Plan:     1. Rash Resolved today Possible allergic, however sx last unusually long time for an allergic reaction, and did not improved with benadryl or topical steroids. Try- metroNIDAZOLE (METROGEL) 1 % gel; Apply topically 2 (two) times daily as needed.  Dispense: 60 g; Refill: 1 Consider allergy testing or dermatology referral if not effective.       Lelon Huh, MD  [facility]

## 2018-09-01 ENCOUNTER — Encounter: Payer: Self-pay | Admitting: Family Medicine

## 2018-09-01 ENCOUNTER — Ambulatory Visit (INDEPENDENT_AMBULATORY_CARE_PROVIDER_SITE_OTHER): Payer: 59 | Admitting: Family Medicine

## 2018-09-01 ENCOUNTER — Other Ambulatory Visit: Payer: Self-pay

## 2018-09-01 VITALS — BP 142/80 | HR 72 | Temp 97.9°F | Resp 16 | Ht 68.0 in | Wt 212.0 lb

## 2018-09-01 DIAGNOSIS — J0110 Acute frontal sinusitis, unspecified: Secondary | ICD-10-CM | POA: Diagnosis not present

## 2018-09-01 DIAGNOSIS — Z0000 Encounter for general adult medical examination without abnormal findings: Secondary | ICD-10-CM | POA: Diagnosis not present

## 2018-09-01 MED ORDER — FLUTICASONE PROPIONATE 50 MCG/ACT NA SUSP: 2.0000 | Freq: Every day | NASAL | 6 refills | Status: DC

## 2018-09-01 MED ORDER — AMOXICILLIN 500 MG PO CAPS: 1000.0000 mg | ORAL_CAPSULE | Freq: Two times a day (BID) | ORAL | 0 refills | Status: AC

## 2018-09-01 NOTE — Patient Instructions (Addendum)
.   Please call the [facility] to schedule a routine screening mammogram.  Health Maintenance  Topic Date Due  . INFLUENZA VACCINE  09/14/2018 (Originally 01/14/2018)  . COLONOSCOPY  08/29/2019  . PAP SMEAR-Modifier  07/02/2020  . TETANUS/TDAP  07/03/2027  . Hepatitis C Screening  Completed

## 2018-09-01 NOTE — Progress Notes (Signed)
Patient: Tina Fuller, Female    DOB: Jan 10, 1961, 58 y.o.   MRN: [id number] Visit Date: 09/01/2018  Today's Provider: Lelon Huh, MD   Chief Complaint  Patient presents with  . Annual Exam  . Hyperlipidemia   Subjective:     Annual physical exam Tina Fuller is a 58 y.o. female who presents today for health maintenance and complete physical. She feels fairly well. She reports exercising daily. She reports she is sleeping poorly.  -----------------------------------------------------------------  Lipid/Cholesterol, Follow-up:   Last seen for this1 years ago.  Management changes since that visit include none. . Last Lipid Panel:    Component Value Date/Time   CHOL 198 07/02/2017 1018   TRIG 68 07/02/2017 1018   HDL 74 07/02/2017 1018   CHOLHDL 2.7 07/02/2017 1018   LDLCALC 110 (H) 07/02/2017 1018    Risk factors for vascular disease include hypercholesterolemia  She reports good compliance with treatment. She is not having side effects.  Current symptoms include none and have been stable. Weight trend: stable Prior visit with dietician: no Current diet: well balanced Current exercise: walking  Wt Readings from Last 3 Encounters:  09/01/18 212 lb (96.2 kg)  04/27/18 212 lb (96.2 kg)  02/16/18 215 lb (97.5 kg)    ------------------------------------------------------------------- Also states she feel  Like she has a sinus infection with frontal and post nasal congestion, yellow discharge and mild cough.   Review of Systems  Constitutional: Negative for chills, fatigue and fever.  HENT: Positive for rhinorrhea, sinus pressure and sneezing. Negative for congestion, ear pain and sore throat.   Eyes: Positive for photophobia and visual disturbance. Negative for pain and redness.  Respiratory: Negative for cough, shortness of breath and wheezing.   Cardiovascular: Positive for palpitations. Negative for chest pain and leg swelling.   Gastrointestinal: Positive for nausea. Negative for abdominal pain, blood in stool, constipation and diarrhea.  Endocrine: Negative for polydipsia and polyphagia.  Genitourinary: Positive for vaginal discharge (clear odorless). Negative for dysuria, flank pain, hematuria, pelvic pain and vaginal bleeding.  Musculoskeletal: Positive for arthralgias and back pain. Negative for gait problem and joint swelling.  Skin: Negative for rash.  Neurological: Positive for numbness and headaches. Negative for dizziness, tremors, seizures, weakness and light-headedness.  Hematological: Negative for adenopathy.  Psychiatric/Behavioral: Negative.  Negative for behavioral problems, confusion and dysphoric mood. The patient is not nervous/anxious and is not hyperactive.     Social History      She  reports that she has quit smoking. She has a 2.00 pack-year smoking history. She has never used smokeless tobacco. She reports current alcohol use. She reports that she does not use drugs.       Social History   Socioeconomic History  . Marital status: Married    Spouse name: Not on file  . Number of children: 2  . Years of education: GED  . Highest education level: Not on file  Occupational History  . 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 Financial resource strain: Not on file  . Food insecurity:    Worry: Not on file    Inability: Not on file  . Transportation needs:    Medical: Not on file    Non-medical: Not on file  Tobacco Use  . Smoking status: Former Smoker    Packs/day: 0.25    Years: 8.00    Pack years: 2.00  .  Smokeless tobacco: Never Used  . Tobacco comment: smoked less than 1/2 ppd for 5-10 years   Substance and Sexual Activity  . Alcohol use: Yes    Comment: occasional   . Drug use: No  . Sexual activity: Not on file  Lifestyle  . Physical activity:    Days per week: Not on file    Minutes per session: Not on file   . Stress: Not on file  Relationships  . Social connections:    Talks on phone: Not on file    Gets together: Not on file    Attends religious service: Not on file    Active member of club or organization: Not on file    Attends meetings of clubs or organizations: Not on file    Relationship status: Not on file  Other Topics Concern  . Not on file  Social History Narrative   Divorced; full time; exercises regularly - walking     Past Medical History:  Diagnosis Date  . History of chicken pox   . Low back pain   . Palpitations   . Trochanteric bursitis of left hip   . Tubular adenoma of colon 08/2014     Patient Active Problem List   Diagnosis Date Noted  . Muscle spasms of neck 08/19/2017  . Leg pain 08/19/2017  . Family history of premature CAD 07/02/2017  . Helicobacter pylori gastritis 08/22/2015  . 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 Low back pain radiating to left leg 05/21/2015  . Neuralgia neuritis, sciatic nerve 05/21/2015  . Trochanteric bursitis of left hip 05/21/2015  . Tubular adenoma of colon 05/21/2015  . Fatigue 05/21/2015  . Obesity 03/21/2015  . Big thyroid 02/05/2015  . Hyperlipidemia 12/05/2013  . Bulky or enlarged uterus 10/19/2012  . PALPITATIONS 01/25/2010  . SHORTNESS OF BREATH 01/25/2010    Past Surgical History:  Procedure Laterality Date  . beign breast biopsy    . fissurotomy      Family History        Family Status  Relation Name Status  . Father  Deceased at age 1       heart disease and CA  . Mother  Deceased at age 40       HTN; non-insulin dependent diabetes, alzheimers  . Brother  Alive       CHF and stroke  . Brother  Alive       CAD in 2s  . Sister  Alive       CAD with a stent at 66         Her family history includes Alzheimer's disease in her mother; Cerebrovascular Accident in her father and mother; Diabetes in her brother, mother, and sister; Heart disease in her brother, brother, and sister; Heart failure in her  father; Hyperlipidemia in her brother, brother, father, and mother; Hypertension in her brother, brother, father, and mother.         Office Visit from 07/02/2017 in [place]  PHQ-2 Total Score  0      No Known Allergies  No current outpatient medications on file.   Patient Care Team: Birdie Sons, MD as PCP - General (Family Medicine) Manus Rudd, NP as Nurse Practitioner (Nurse Practitioner) Minna Merritts, MD as Consulting Physician (Cardiology)    Objective:    Vitals: BP (!) 142/80 (BP Location: Left Arm, Patient Position: Sitting, Cuff Size: Large)   Pulse 72   Temp 97.9 F (36.6 C) (Oral)  Resp 16   Ht 5\' 8"  (1.727 m)   Wt 212 lb (96.2 kg)   SpO2 99% Comment: room air  BMI 32.23 kg/m    Vitals:   09/01/18 0929  BP: (!) 142/80  Pulse: 72  Resp: 16  Temp: 97.9 F (36.6 C)  TempSrc: Oral  SpO2: 99%  Weight: 212 lb (96.2 kg)  Height: 5\' 8"  (1.727 m)     Physical Exam   General Appearance:    Alert, cooperative, no distress, appears stated age  Head:    Normocephalic, without obvious abnormality, atraumatic  Eyes:    PERRL, conjunctiva/corneas clear, EOM's intact, fundi    benign, both eyes  Ears:    Normal TM's and external ear canals, both ears  Nose:   Nares normal, septum midline, mucosa normal, no drainage    or sinus tenderness  Throat:   Lips, mucosa, and tongue normal; teeth and gums normal  Neck:   Supple, symmetrical, trachea midline, no adenopathy;    thyroid:  no enlargement/tenderness/nodules; no carotid   bruit or JVD  Back:     Symmetric, no curvature, ROM normal, no CVA tenderness  Lungs:     Clear to auscultation bilaterally, respirations unlabored  Chest Wall:    No tenderness or deformity   Heart:    Regular rate and rhythm, S1 and S2 normal, no murmur, rub   or gallop  Breast Exam:    normal appearance, no masses or tenderness  Abdomen:     Soft, non-tender, bowel sounds active all four quadrants,    no  masses, no organomegaly  Pelvic:    deferred  Extremities:   Extremities normal, atraumatic, no cyanosis or edema  Pulses:   2+ and symmetric all extremities  Skin:   Skin color, texture, turgor normal, no rashes or lesions  Lymph nodes:   Cervical, supraclavicular, and axillary nodes normal  Neurologic:   CNII-XII intact, normal strength, sensation and reflexes    throughout    Depression Screen PHQ 2/9 Scores 07/02/2017 07/17/2015  PHQ - 2 Score 0 3  PHQ- 9 Score 0 7       Assessment & Plan:     Routine Health Maintenance and Physical Exam  Exercise Activities and Dietary recommendations Goals   None     Immunization History  Administered Date(s) Administered  . Td 07/02/2017  . Tdap 11/18/2006  . Zoster Recombinat (Shingrix) 07/02/2017, 09/14/2017    Health Maintenance  Topic Date Due  . HIV Screening  04/09/1978  . INFLUENZA VACCINE  01/14/2018  . MAMMOGRAM  07/24/2019  . COLONOSCOPY  08/29/2019  . PAP SMEAR-Modifier  07/02/2020  . TETANUS/TDAP  07/03/2027  . Hepatitis C Screening  Completed     Discussed health benefits of physical activity, and encouraged her to engage in regular exercise appropriate for her age and condition.    --------------------------------------------------------------------  1. Annual physical exam Generally doing well. She is still having regular menstrual cycle. Discussed contraception options and advised it is still possible to get pregnant. However she states she is unable to get pregnant due to having had multiple ectopic pregnancies and is not interest in any form of contraception.  .- Comprehensive metabolic panel - Lipid panel  2. Acute frontal sinusitis, recurrence not specified  - amoxicillin (AMOXIL) 500 MG capsule; Take 2 capsules (1,000 mg total) by mouth 2 (two) times daily for 10 days.  Dispense: 40 capsule; Refill: 0 - fluticasone (FLONASE) 50 MCG/ACT nasal spray; Place 2 sprays  into both nostrils daily.   Dispense: 16 g; Refill: 6   Lelon Huh, MD  [facility]

## 2018-09-02 ENCOUNTER — Telehealth: Payer: Self-pay

## 2018-09-02 LAB — LIPID PANEL
CHOL/HDL RATIO: 3.1 ratio (ref 0.0–4.4)
Cholesterol, Total: 225 mg/dL — ABNORMAL HIGH (ref 100–199)
HDL: 73 mg/dL (ref >39)
LDL Calculated: 134 mg/dL — ABNORMAL HIGH (ref 0–99)
Triglycerides: 88 mg/dL (ref 0–149)
VLDL Cholesterol Cal: 18 mg/dL (ref 5–40)

## 2018-09-02 LAB — COMPREHENSIVE METABOLIC PANEL
A/G RATIO: 1.8 (ref 1.2–2.2)
ALT: 15 IU/L (ref 0–32)
AST: 19 IU/L (ref 0–40)
Albumin: 4.5 g/dL (ref 3.8–4.9)
Alkaline Phosphatase: 58 IU/L (ref 39–117)
BILIRUBIN TOTAL: 0.3 mg/dL (ref 0.0–1.2)
BUN/Creatinine Ratio: 22 (ref 9–23)
BUN: 13 mg/dL (ref 6–24)
CALCIUM: 10.1 mg/dL (ref 8.7–10.2)
CHLORIDE: 104 mmol/L (ref 96–106)
CO2: 21 mmol/L (ref 20–29)
Creatinine, Ser: 0.58 mg/dL (ref 0.57–1.00)
GFR, EST AFRICAN AMERICAN: 120 mL/min/{1.73_m2} (ref >59)
GFR, EST NON AFRICAN AMERICAN: 104 mL/min/{1.73_m2} (ref >59)
GLOBULIN, TOTAL: 2.5 g/dL (ref 1.5–4.5)
Glucose: 88 mg/dL (ref 65–99)
POTASSIUM: 4.5 mmol/L (ref 3.5–5.2)
SODIUM: 140 mmol/L (ref 134–144)
TOTAL PROTEIN: 7 g/dL (ref 6.0–8.5)

## 2018-09-02 NOTE — Telephone Encounter (Signed)
Pt advised.   Thanks,   -Laura  

## 2018-09-02 NOTE — Telephone Encounter (Signed)
-----   Message from Birdie Sons, MD sent at 09/02/2018  8:21 AM EDT ----- Cholesterol is up a little bit. Cut back on saturated fats in diet. Otherwise labs are good.

## 2018-09-02 NOTE — Telephone Encounter (Signed)
LMTCB 09/02/2018  Thanks,   -Mickel Baas

## 2018-09-13 ENCOUNTER — Telehealth: Payer: Self-pay

## 2018-09-13 MED ORDER — FLUCONAZOLE 150 MG PO TABS: 150.0000 mg | ORAL_TABLET | Freq: Every day | ORAL | 0 refills | Status: DC

## 2018-09-13 NOTE — Telephone Encounter (Signed)
Patient called saying that she was seen in the office last week and was prescribed an abx. She is requesting something to take for a yeast infection. [facility] [place]. Please review. Thanks!

## 2018-10-29 ENCOUNTER — Telehealth: Payer: Self-pay

## 2018-10-29 NOTE — Telephone Encounter (Signed)
Called patient from recall list.  No answer. LMOV.  This is the second attempt per recall list.   

## 2019-03-08 LAB — HM MAMMOGRAPHY

## 2019-07-21 ENCOUNTER — Telehealth: Payer: Self-pay | Admitting: Cardiovascular Disease

## 2019-07-21 NOTE — Telephone Encounter (Signed)
Have made 3 or more attempts to schedule with no success Deleting recall

## 2019-09-10 ENCOUNTER — Ambulatory Visit: Payer: Self-pay | Attending: Internal Medicine

## 2019-09-10 DIAGNOSIS — Z23 Encounter for immunization: Secondary | ICD-10-CM

## 2019-09-10 NOTE — Progress Notes (Signed)
   Covid-19 Vaccination Clinic  Name:  Tina Fuller    MRN: [id number] DOB: Oct 01, 1960  09/10/2019  [name] was observed post Covid-19 immunization for 15 minutes without incident. She was provided with Vaccine Information Sheet and instruction to access the V-Safe system.   [name] was instructed to call 911 with any severe reactions post vaccine: Marland Kitchen Difficulty breathing  . Swelling of face and throat  . A fast heartbeat  . A bad rash all over body  . Dizziness and weakness   Immunizations Administered    Name Date Dose VIS Date Route   Pfizer COVID-19 Vaccine 09/10/2019  6:02 PM 0.3 mL 05/27/2019 Intramuscular   Manufacturer: [place]   Lot: [id number]   [place]: [id number]

## 2019-09-30 ENCOUNTER — Ambulatory Visit: Payer: Self-pay

## 2019-10-08 ENCOUNTER — Other Ambulatory Visit: Payer: Self-pay

## 2019-10-08 ENCOUNTER — Ambulatory Visit: Payer: Self-pay | Attending: Internal Medicine

## 2019-10-08 DIAGNOSIS — Z23 Encounter for immunization: Secondary | ICD-10-CM

## 2019-10-08 NOTE — Progress Notes (Signed)
   Covid-19 Vaccination Clinic  Name:  Tina Fuller    MRN: [id number] DOB: 18-Dec-1960  10/08/2019  [name] was observed post Covid-19 immunization for 15 minutes without incident. She was provided with Vaccine Information Sheet and instruction to access the V-Safe system.   [name] was instructed to call 911 with any severe reactions post vaccine: Marland Kitchen Difficulty breathing  . Swelling of face and throat  . A fast heartbeat  . A bad rash all over body  . Dizziness and weakness   Immunizations Administered    Name Date Dose VIS Date Route   Pfizer COVID-19 Vaccine 10/08/2019  6:01 PM 0.3 mL 08/10/2018 Intramuscular   Manufacturer: [employer], [employer]   Lot: [id number]   [place]: [id number]

## 2019-12-05 NOTE — Progress Notes (Deleted)
Complete physical exam   Patient: Tina Fuller   DOB: Jun 20, 1960   59 y.o. Female  MRN: [id number] Visit Date: 12/07/2019  Today's healthcare provider: Lelon Huh, MD   No chief complaint on file.  Subjective    Tina Fuller is a 59 y.o. female who presents today for a complete physical exam.  She reports consuming a {diet types:17450} diet. {Exercise:19826} She generally feels {well/fairly well/poorly:18703}. She reports sleeping {well/fairly well/poorly:18703}. She {does/does not:200015} have additional problems to discuss today.   Last Pap smear: 07/02/2017- Normal; HPV Negative  Last Mammogram: 03/08/2019- BI-RADS CAT 2 (done at [facility] system)  HPI   Lipid/Cholesterol, Follow-up  Last lipid panel Other pertinent labs  Lab Results  Component Value Date   CHOL 225 (H) 09/01/2018   HDL 73 09/01/2018   LDLCALC 134 (H) 09/01/2018   TRIG 88 09/01/2018   CHOLHDL 3.1 09/01/2018   Lab Results  Component Value Date   ALT 15 09/01/2018   AST 19 09/01/2018   PLT 398 (H) 07/02/2017   TSH 0.657 07/02/2017     She was last seen for this 1 years ago.  Management since that visit includes counseling patient to cut back on saturated fats in diet.  She reports {excellent/good/fair/poor:19665} compliance with treatment. She {is/is not:9024} having side effects. {document side effects if present:1}  Symptoms: {Yes/No:20286} chest pain {Yes/No:20286} chest pressure/discomfort  {Yes/No:20286} dyspnea {Yes/No:20286} lower extremity edema  {Yes/No:20286} numbness or tingling of extremity {Yes/No:20286} orthopnea  {Yes/No:20286} palpitations {Yes/No:20286} paroxysmal nocturnal dyspnea  {Yes/No:20286} speech difficulty {Yes/No:20286} syncope   Current diet: {diet habits:16563} Current exercise: {exercise types:16438}  The ASCVD Risk score Mikey Bussing DC Jr., et al., 2013) failed to calculate for the following reasons:   The systolic blood pressure is  missing  ---------------------------------------------------------------------------------------------------  Past Medical History:  Diagnosis Date  . History of chicken pox   . Low back pain   . Palpitations   . Trochanteric bursitis of left hip   . Tubular adenoma of colon 08/2014   Past Surgical History:  Procedure Laterality Date  . BREAST BIOPSY     Benign  . fissurotomy     Social History   Socioeconomic History  . Marital status: Married    Spouse name: Not on file  . Number of children: 2  . Years of education: GED  . Highest education level: Not on file  Occupational History  . 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 Smoking status: Former Smoker    Packs/day: 0.25    Years: 8.00    Pack years: 2.00  . Smokeless tobacco: Never Used  . Tobacco comment: smoked less than 1/2 ppd for 5-10 years   Substance and Sexual Activity  . Alcohol use: Yes    Comment: occasional   . Drug use: No  . Sexual activity: Not on file  Other Topics Concern  . Not on file  Social History Narrative   Divorced; full time; exercises regularly - walking    Social Determinants of Health   Financial Resource Strain:   . Difficulty of Paying Living Expenses:   Food Insecurity:   . Worried About Charity fundraiser in the Last Year:   . Arboriculturist in the Last Year:   Transportation Needs:   . Film/video editor (Medical):   Marland Kitchen Lack of Transportation (Non-Medical):   Physical Activity:   .  Days of Exercise per Week:   . Minutes of Exercise per Session:   Stress:   . Feeling of Stress :   Social Connections:   . Frequency of Communication with Friends and Family:   . Frequency of Social Gatherings with Friends and Family:   . Attends Religious Services:   . Active Member of Clubs or Organizations:   . Attends Archivist Meetings:   Marland Kitchen Marital Status:   Intimate Partner Violence:   . Fear of  Current or Ex-Partner:   . Emotionally Abused:   Marland Kitchen Physically Abused:   . Sexually Abused:    Family Status  Relation Name Status  . Father  Deceased at age 39       heart disease and CA  . Mother  Deceased at age 53       HTN; non-insulin dependent diabetes, alzheimers  . Brother  Alive       CHF and stroke  . Brother  Alive       CAD in 71s  . Sister  Alive       CAD with a stent at 16    Family History  Problem Relation Age of Onset  . Heart failure Father   . Hyperlipidemia Father   . Hypertension Father   . Cerebrovascular Accident Father   . Hypertension Mother   . Hyperlipidemia Mother   . Diabetes Mother   . Alzheimer's disease Mother   . Cerebrovascular Accident Mother   . Heart disease Brother   . Hypertension Brother   . Hyperlipidemia Brother   . Diabetes Brother   . Heart disease Brother   . Hyperlipidemia Brother   . Hypertension Brother   . Heart disease Sister   . Diabetes Sister    No Known Allergies  Patient Care Team: Birdie Sons, MD as PCP - General (Family Medicine) Manus Rudd, NP as Nurse Practitioner (Nurse Practitioner) Minna Merritts, MD as Consulting Physician (Cardiology)   Medications: Outpatient Medications Prior to Visit  Medication Sig  . fluconazole (DIFLUCAN) 150 MG tablet Take 1 tablet (150 mg total) by mouth daily.  . fluticasone (FLONASE) 50 MCG/ACT nasal spray Place 2 sprays into both nostrils daily.   No facility-administered medications prior to visit.    Review of Systems  Constitutional: Negative for chills, fatigue and fever.  HENT: Negative for congestion, ear pain, rhinorrhea, sneezing and sore throat.   Eyes: Negative.  Negative for pain and redness.  Respiratory: Negative for cough, shortness of breath and wheezing.   Cardiovascular: Negative for chest pain and leg swelling.  Gastrointestinal: Negative for abdominal pain, blood in stool, constipation, diarrhea and nausea.  Endocrine: Negative for  polydipsia and polyphagia.  Genitourinary: Negative.  Negative for dysuria, flank pain, hematuria, pelvic pain, vaginal bleeding and vaginal discharge.  Musculoskeletal: Negative for arthralgias, back pain, gait problem and joint swelling.  Skin: Negative for rash.  Neurological: Negative.  Negative for dizziness, tremors, seizures, weakness, light-headedness, numbness and headaches.  Hematological: Negative for adenopathy.  Psychiatric/Behavioral: Negative.  Negative for behavioral problems, confusion and dysphoric mood. The patient is not nervous/anxious and is not hyperactive.     {Heme  Chem  Endocrine  Serology  Results Review (optional):23779::" "}  Objective    There were no vitals taken for this visit. {Show previous vital signs (optional):23777::" "}  Physical Exam  ***  Depression Screen  PHQ 2/9 Scores 09/01/2018 07/02/2017 07/17/2015  PHQ - 2 Score 0 0 3  PHQ-  9 Score 2 0 7    No results found for any visits on 12/07/19.  Assessment & Plan    Routine Health Maintenance and Physical Exam  Exercise Activities and Dietary recommendations Goals   None     Immunization History  Administered Date(s) Administered  . PFIZER SARS-COV-2 Vaccination 09/10/2019, 10/08/2019  . Td 07/02/2017  . Tdap 11/18/2006  . Zoster Recombinat (Shingrix) 07/02/2017, 09/14/2017    Health Maintenance  Topic Date Due  . HIV Screening  Never done  . COLONOSCOPY  08/29/2019  . INFLUENZA VACCINE  01/15/2020  . PAP SMEAR-Modifier  07/02/2020  . MAMMOGRAM  03/07/2021  . TETANUS/TDAP  07/03/2027  . COVID-19 Vaccine  Completed  . Hepatitis C Screening  Completed    Discussed health benefits of physical activity, and encouraged her to engage in regular exercise appropriate for her age and condition.  ***  No follow-ups on file.     {provider attestation***:1}   Lelon Huh, MD  [facility] [phone] (phone) [phone] (fax)  [place]

## 2019-12-07 ENCOUNTER — Encounter: Payer: No Typology Code available for payment source | Admitting: Family Medicine

## 2019-12-07 ENCOUNTER — Telehealth: Payer: Self-pay

## 2019-12-07 NOTE — Telephone Encounter (Signed)
Copied from [place] [id number]. Topic: Appointment Scheduling - Scheduling Inquiry for Clinic >> Dec 07, 2019  9:29 AM Oneta Rack wrote: Patient called to confirm her physical appointment and was advised it was today at [place] patient next available CPE which is 03/16/2020 at 2pm. Patient was apologetic and would like PCP to consider working her in sooner then October. Informed patient  she has been placed on the wait list.

## 2019-12-26 NOTE — Progress Notes (Deleted)
Cardiology Office Note  Date:  12/26/2019   ID:  Cherly, Erno 1961-04-04, MRN [id number]  PCP:  Birdie Sons, MD   No chief complaint on file.   HPI:  59 year old woman with  obesity,   no known cardiac history,  family history of diabetes and heart disease  seen in 2011 for tachycardia, chest tightness.  She presents for routine followup of her blood pressure, risk factors tachycardia,, leg pain, arm pain  On her last clinic visit over one year ago she reported having leg pain She had good pulses at the time, it was felt discomfort was not from claudication  Still with leg pain, night Some in the day,  Worse after she has been on her feet all day Periodic cramps in her legs worse at night  She reports having pain in her posterior shoulders, back of the neck, sometimes down her left arm Discomfort on palpation, sometimes with movement Works in a meat packing, processing area all day, lots of repetitive motion Works at [employer] lab work reviewed with her showing total cholesterol around 200, LDL 110  Blood pressure elevated on initial arrival, improved down to 120/65 on my recheck   She used to smoke but stopped approximately 15 years ago.  EKG personally reviewed by myself on todays visit Shows normal sinus rhythm rate 74 bpm no significant ST or T wave changes   PMH:   has a past medical history of History of chicken pox, Low back pain, Palpitations, Trochanteric bursitis of left hip, and Tubular adenoma of colon (08/2014).  PSH:    Past Surgical History:  Procedure Laterality Date   BREAST BIOPSY     Benign   fissurotomy      Current Outpatient Medications  Medication Sig Dispense Refill   fluconazole (DIFLUCAN) 150 MG tablet Take 1 tablet (150 mg total) by mouth daily. 1 tablet 0   fluticasone (FLONASE) 50 MCG/ACT nasal spray Place 2 sprays into both nostrils daily. 16 g 6   No current facility-administered medications  for this visit.     Allergies:   Patient has no known allergies.   Social History:  The patient  reports that she has quit smoking. She has a 2.00 pack-year smoking history. She has never used smokeless tobacco. She reports current alcohol use. She reports that she does not use drugs.   Family History:   family history includes Alzheimer's disease in her mother; Cerebrovascular Accident in her father and mother; Diabetes in her brother, mother, and sister; Heart disease in her brother, brother, and sister; Heart failure in her father; Hyperlipidemia in her brother, brother, father, and mother; Hypertension in her brother, brother, father, and mother.    Review of Systems: Review of Systems  Constitutional: Negative.   Respiratory: Negative.   Cardiovascular: Negative.   Gastrointestinal: Negative.   Musculoskeletal: Positive for myalgias.       Leg pain, posterior shoulders, back of the head  Neurological: Negative.   Psychiatric/Behavioral: Negative.   All other systems reviewed and are negative.    PHYSICAL EXAM: VS:  There were no vitals taken for this visit. , BMI There is no height or weight on file to calculate BMI. GEN: Well nourished, well developed, in no acute distress  HEENT: normal  Neck: no JVD, carotid bruits, or masses Cardiac: RRR; no murmurs, rubs, or gallops,no edema  Respiratory:  clear to auscultation bilaterally, normal work of breathing GI: soft, nontender, nondistended, + BS  MS: no deformity or atrophy  Skin: warm and dry, no rash Neuro:  Strength and sensation are intact Psych: euthymic mood, full affect    Recent Labs: No results found for requested labs within last 8760 hours.    Lipid Panel Lab Results  Component Value Date   CHOL 225 (H) 09/01/2018   HDL 73 09/01/2018   LDLCALC 134 (H) 09/01/2018   TRIG 88 09/01/2018      Wt Readings from Last 3 Encounters:  09/01/18 212 lb (96.2 kg)  04/27/18 212 lb (96.2 kg)  02/16/18 215 lb (97.5  kg)       ASSESSMENT AND PLAN:  Palpitations - Plan: EKG 12-Lead Denies having any palpitations, improved since her last clinic visit No further workup  Pure hypercholesterolemia Reasonable cholesterol numbers, No other risk factors.  Recommended weight loss No strong indication for statin at this time  Shortness of breath Recommended regular exercise program for weight loss and conditioning Reports that she recently joined the [employer]  Paravertebral muscle spasms She was concerned this was cardiac related Recommended she monitor her posture, by different pillow, try massage, Talk with primary care about muscle relaxer medications, NSAIDs  Left leg pain Likely musculoskeletal, She has good extremity/distal pulses, no PAD suspected Recommended she try compression hose when she is working, as she spends long hours on her feet   Total encounter time more than 25 minutes  Greater than 50% was spent in counseling and coordination of care with the patient   Disposition:   F/U  12 months as needed    No orders of the defined types were placed in this encounter.    Signed, Esmond Plants, M.D., Ph.D. 12/26/2019  [place], [place]

## 2019-12-27 ENCOUNTER — Ambulatory Visit: Payer: Self-pay | Admitting: Cardiovascular Disease

## 2019-12-28 ENCOUNTER — Ambulatory Visit (INDEPENDENT_AMBULATORY_CARE_PROVIDER_SITE_OTHER): Payer: No Typology Code available for payment source | Admitting: Family

## 2019-12-28 ENCOUNTER — Other Ambulatory Visit: Payer: Self-pay

## 2019-12-28 ENCOUNTER — Encounter: Payer: Self-pay | Admitting: Family

## 2019-12-28 VITALS — BP 150/82 | HR 63 | Ht 68.0 in | Wt 209.0 lb

## 2019-12-28 DIAGNOSIS — E782 Mixed hyperlipidemia: Secondary | ICD-10-CM | POA: Diagnosis not present

## 2019-12-28 DIAGNOSIS — I739 Peripheral vascular disease, unspecified: Secondary | ICD-10-CM | POA: Diagnosis not present

## 2019-12-28 DIAGNOSIS — I872 Venous insufficiency (chronic) (peripheral): Secondary | ICD-10-CM | POA: Diagnosis not present

## 2019-12-28 DIAGNOSIS — R002 Palpitations: Secondary | ICD-10-CM

## 2019-12-28 DIAGNOSIS — R600 Localized edema: Secondary | ICD-10-CM

## 2019-12-28 MED ORDER — LOSARTAN POTASSIUM 25 MG PO TABS
25.0000 mg | ORAL_TABLET | Freq: Every day | ORAL | 3 refills | Status: DC
Start: 2019-12-28 — End: 2020-08-29

## 2019-12-28 NOTE — Progress Notes (Signed)
Office Visit    Patient Name: Tina Fuller Date of Encounter: 12/28/2019  Primary Care Provider:  Birdie Sons, MD Primary Cardiologist:  Ida Rogue, MD Electrophysiologist:  None   Chief Complaint    Tina Fuller is a 59 y.o. female with a hx of HTN, previous tobacco use, HLD, leg pain presents today for elevated blood pressures and leg pain.    Past Medical History    Past Medical History:  Diagnosis Date   History of chicken pox    Low back pain    Palpitations    Trochanteric bursitis of left hip    Tubular adenoma of colon 08/2014   Past Surgical History:  Procedure Laterality Date   BREAST BIOPSY     Benign   fissurotomy      Allergies  No Known Allergies  History of Present Illness    Tina Fuller is a 59 y.o. female with a hx ofHTN, previous tobacco use, HLD, leg pain. She was last seen 08/2017 by Dr. Rockey Situ. Family history notable for heart disease and diabetes.   Previously evaluated by Dr. Rockey Situ for tachycardia, chest tightness, palpitations. Previous chest tightness atypical for angina and presumed musculoskeletal. Palpitations self resolved. She has previously reported LE pain but as her pedal pulses were good, imaging was deferred.  She has never been on antihypertensive per her report. On review she has had elevated blood pressures back to 2019. Reports she check her BP intermittently at home with readings mostly 140s-150s/80s. We discussed BP goal of <130/80.  Shares with me that she gets pain in her legs at night when she is sleeping which she describes as cramping.  Also endorses pain particularly left lower extremity with ambulation that is relieved by rest.  Endorses lower extremity edema that is worse at the end of the day.  She is very concerned about PAD after viewing a posterior in the room.  EKGs/Labs/Other Studies Reviewed:   The following studies were reviewed today:   EKG:  EKG is ordered today.   The ekg ordered today demonstrates NSR 63 bpm.  Recent Labs: No results found for requested labs within last 8760 hours.  Recent Lipid Panel    Component Value Date/Time   CHOL 225 (H) 09/01/2018 1030   TRIG 88 09/01/2018 1030   HDL 73 09/01/2018 1030   CHOLHDL 3.1 09/01/2018 1030   LDLCALC 134 (H) 09/01/2018 1030    Home Medications   Current Meds  Medication Sig   aspirin EC 81 MG tablet Take 81 mg by mouth daily. Swallow whole.   Cobalamin Combinations (NEURIVA PLUS PO) Take by mouth daily.   Multiple Vitamin (MULTIVITAMIN) tablet Take 1 tablet by mouth daily.   NON FORMULARY Vita Fusion Takes 1 tablet qd.    Review of Systems    Review of Systems  Constitutional: Negative for chills, fever and malaise/fatigue.  Cardiovascular: Positive for claudication. Negative for chest pain, dyspnea on exertion, leg swelling, near-syncope, orthopnea, palpitations and syncope.  Respiratory: Negative for cough, shortness of breath and wheezing.   Musculoskeletal:       (+) leg pain L > R  Gastrointestinal: Negative for nausea and vomiting.  Neurological: Negative for dizziness, light-headedness and weakness.   All other systems reviewed and are otherwise negative except as noted above.  Physical Exam    VS:  BP (!) 150/82 (BP Location: Left Arm, Patient Position: Sitting, Cuff Size: Normal)    Pulse 63  Ht 5\' 8"  (1.727 m)    Wt 209 lb (94.8 kg)    SpO2 99%    BMI 31.78 kg/m  , BMI Body mass index is 31.78 kg/m. GEN: Well nourished, overweight, well developed, in no acute distress. HEENT: normal. Neck: Supple, no JVD, carotid bruits, or masses. Cardiac: RRR, no murmurs, rubs, or gallops. No clubbing, cyanosis, edema.  Radials/DP/PT 2+ and equal bilaterally.  Respiratory:  Respirations regular and unlabored, clear to auscultation bilaterally. GI: Soft, nontender, nondistended, BS + x 4. MS: No deformity or atrophy. Skin: Warm and dry, no rash. Neuro:  Strength and sensation  are intact. Psych: Normal affect.  Assessment & Plan    1. HTN -BP elevated today noted to be elevated back to 2019.  Plan to start antihypertensive regimen.  Start losartan 25 mg daily.  Recommend regular cardiovascular exercise.  Recommend low-sodium, healthy diet.  CMP today.  2. LE pain / ? Claudication / venous insufficiency -reports leg pain and cramping at night.  Also reports leg pain particular in her left leg with ambulation.  Also with lower extremity edema worsening at the end of the day.  We discussed the likely multifactorial etiology including venous insufficiency.  Concern for PAD given her leg pain with ambulation that resolves with rest.  We will plan for lower extremity duplex and ABI.  CMP today for assessment of electrolytes.  Encouraged her to wear compression stockings, follow low-sodium diet, sit with her feet elevated.  If lower extremity duplex and ABI unrevealing could consider referral to vascular surgery.  3. Chest pain / palpitations - Reports no recurrence. No indication for further evaluation at this time.   4. HLD - Not presently on statin. Lipid panel today for monitoring.   Disposition: Follow up in 3 week(s) with Dr. Rockey Situ or APP. Plan for repeat BMP at that time for monitoring of Losartan.   Loel Dubonnet, NP 12/28/2019, 1:03 PM

## 2019-12-28 NOTE — Patient Instructions (Addendum)
Medication Instructions:  Your physician has recommended you make the following change in your medication:   START Losartan 25mg  daily  *If you need a refill on your cardiac medications before your next appointment, please call your pharmacy*   Lab Work: Your physician recommends lab work today: CMET, lipid panel, CBC  If you have labs (blood work) drawn today and your tests are completely normal, you will receive your results only by: Marland Kitchen MyChart Message (if you have MyChart) OR . A paper copy in the mail If you have any lab test that is abnormal or we need to change your treatment, we will call you to review the results.  Testing/Procedures: Your physician has requested that you have an ankle brachial index (ABI). During this test an ultrasound and blood pressure cuff are used to evaluate the arteries that supply the arms and legs with blood. Allow thirty minutes for this exam. There are no restrictions or special instructions.  Your physician has requested that you have a lower extremity arterial duplex. During this test, ultrasound is used to evaluate arterial blood flow in the legs. Allow one hour for this exam. There are no restrictions or special instructions.    Follow-Up: At [facility], you and your health needs are our priority.  As part of our continuing mission to provide you with exceptional heart care, we have created designated Provider Care Teams.  These Care Teams include your primary Cardiologist (physician) and Advanced Practice Providers (APPs -  Physician Assistants and Nurse Practitioners) who all work together to provide you with the care you need, when you need it.  We recommend signing up for the patient portal called "MyChart".  Sign up information is provided on this After Visit Summary.  MyChart is used to connect with patients for Virtual Visits (Telemedicine).  Patients are able to view lab/test results, encounter notes, upcoming appointments, etc.   Non-urgent messages can be sent to your provider as well.   To learn more about what you can do with MyChart, go to [email or web address].    Your next appointment:   3 week(s)  The format for your next appointment:   In Person  Provider:    You may see Ida Rogue, MD or one of the following Advanced Practice Providers on your designated Care Team:    Murray Hodgkins, NP  Christell Faith, PA-C  Marrianne Mood, PA-C    Other Instructions   Recommend wearing compression stockings.  Recommend low salt diet.   Tips to Measure your Blood Pressure Correctly  To determine whether you have hypertension, a medical professional will take a blood pressure reading. How you prepare for the test, the position of your arm, and other factors can change a blood pressure reading by 10% or more. That could be enough to hide high blood pressure, start you on a drug you don't really need, or lead your doctor to incorrectly adjust your medications.  National and international guidelines offer specific instructions for measuring blood pressure. If a doctor, nurse, or medical assistant isn't doing it right, don't hesitate to ask him or her to get with the guidelines.  Here's what you can do to ensure a correct reading: . Don't drink a caffeinated beverage or smoke during the 30 minutes before the test. . Sit quietly for five minutes before the test begins. . During the measurement, sit in a chair with your feet on the floor and your arm supported so your elbow is at about heart level. Marland Kitchen  The inflatable part of the cuff should completely cover at least 80% of your upper arm, and the cuff should be placed on bare skin, not over a shirt. . Don't talk during the measurement. . Have your blood pressure measured twice, with a brief break in between. If the readings are different by 5 points or more, have it done a third time.  There are times to break these rules. If you sometimes feel lightheaded  when getting out of bed in the morning or when you stand after sitting, you should have your blood pressure checked while seated and then while standing to see if it falls from one position to the next.  Because blood pressure varies throughout the day, your doctor will rarely diagnose hypertension on the basis of a single reading. Instead, he or she will want to confirm the measurements on at least two occasions, usually within a few weeks of one another. The exception to this rule is if you have a blood pressure reading of 180/110 mm Hg or higher. A result this high usually calls for prompt treatment.  It's a good idea to have your blood pressure measured in both arms at least once, since the reading in one arm (usually the right) may be higher than that in the left. A 2014 study in The American Journal of Medicine of nearly 3,400 people found average arm- to-arm differences in systolic blood pressure of about 5 points. The higher number should be used to make treatment decisions.  In 2017, new guidelines from the [place], the [employer] of Cardiology, and nine other health organizations lowered the diagnosis of high blood pressure to 130/80 mm Hg or higher for all adults. The guidelines also redefined the various blood pressure categories to now include normal, elevated, Stage 1 hypertension, Stage 2 hypertension, and hypertensive crisis (see "Blood pressure categories").  Blood pressure categories  Blood pressure category SYSTOLIC (upper number)  DIASTOLIC (lower number)  Normal Less than 120 mm Hg and Less than 80 mm Hg  Elevated 120-129 mm Hg and Less than 80 mm Hg  High blood pressure: Stage 1 hypertension 130-139 mm Hg or 80-89 mm Hg  High blood pressure: Stage 2 hypertension 140 mm Hg or higher or 90 mm Hg or higher  Hypertensive crisis (consult your doctor immediately) Higher than 180 mm Hg and/or Higher than 120 mm Hg  Source: American Heart Association and  American Stroke Association. For more on getting your blood pressure under control, buy Controlling Your Blood Pressure, a Special Health Report from [facility].   Blood Pressure Log   Date   Time  Blood Pressure  Position  Example: Nov 1 9 AM 124/78 sitting

## 2019-12-29 ENCOUNTER — Telehealth: Payer: Self-pay | Admitting: *Deleted

## 2019-12-29 LAB — CBC
Hematocrit: 38.2 % (ref 34.0–46.6)
Hemoglobin: 12.4 g/dL (ref 11.1–15.9)
MCH: 29.5 pg (ref 26.6–33.0)
MCHC: 32.5 g/dL (ref 31.5–35.7)
MCV: 91 fL (ref 79–97)
Platelets: 368 10*3/uL (ref 150–450)
RBC: 4.2 x10E6/uL (ref 3.77–5.28)
RDW: 14.2 % (ref 11.7–15.4)
WBC: 3.5 10*3/uL (ref 3.4–10.8)

## 2019-12-29 LAB — COMPREHENSIVE METABOLIC PANEL
ALT: 11 IU/L (ref 0–32)
AST: 15 IU/L (ref 0–40)
Albumin/Globulin Ratio: 1.7 (ref 1.2–2.2)
Albumin: 4.5 g/dL (ref 3.8–4.9)
Alkaline Phosphatase: 66 IU/L (ref 48–121)
BUN/Creatinine Ratio: 15 (ref 9–23)
BUN: 11 mg/dL (ref 6–24)
Bilirubin Total: <0.2 mg/dL (ref 0.0–1.2)
CO2: 23 mmol/L (ref 20–29)
Calcium: 10.2 mg/dL (ref 8.7–10.2)
Chloride: 105 mmol/L (ref 96–106)
Creatinine, Ser: 0.71 mg/dL (ref 0.57–1.00)
GFR calc Af Amer: 110 mL/min/{1.73_m2} (ref >59)
GFR calc non Af Amer: 96 mL/min/{1.73_m2} (ref >59)
Globulin, Total: 2.6 g/dL (ref 1.5–4.5)
Glucose: 89 mg/dL (ref 65–99)
Potassium: 4.7 mmol/L (ref 3.5–5.2)
Sodium: 137 mmol/L (ref 134–144)
Total Protein: 7.1 g/dL (ref 6.0–8.5)

## 2019-12-29 LAB — LIPID PANEL
Chol/HDL Ratio: 2.8 ratio (ref 0.0–4.4)
Cholesterol, Total: 200 mg/dL — ABNORMAL HIGH (ref 100–199)
HDL: 72 mg/dL (ref >39)
LDL Chol Calc (NIH): 113 mg/dL — ABNORMAL HIGH (ref 0–99)
Triglycerides: 83 mg/dL (ref 0–149)
VLDL Cholesterol Cal: 15 mg/dL (ref 5–40)

## 2019-12-29 NOTE — Telephone Encounter (Signed)
-----   Message from Loel Dubonnet, NP sent at 12/29/2019  8:57 AM EDT ----- Normal kidneys, liver, electrolytes. CBC looked good - no evidence anemia or infection. Cholesterol panel shows her LDL is mildly elevated (113 instead of goal <100). Recommend cholesterol lowering diet (avoid fried foods, eat healthy fats like olive oil).   I will get her handouts at her upcoming 01/25/20 appointment.

## 2019-12-29 NOTE — Telephone Encounter (Signed)
Left message voicemail message to call back for results and recommendations.

## 2019-12-29 NOTE — Telephone Encounter (Signed)
Reviewed results and recommendations with patient and she verbalized understanding with no further questions at this time.  

## 2020-01-04 ENCOUNTER — Telehealth: Payer: Self-pay | Admitting: Cardiovascular Disease

## 2020-01-04 NOTE — Telephone Encounter (Signed)
Left voicemail message to call back  

## 2020-01-04 NOTE — Telephone Encounter (Signed)
Pt c/o medication issue:  1. Name of Medication: Losartan  2. How are you currently taking this medication (dosage and times per day)? 25 mg daily in morning  3. Are you having a reaction (difficulty breathing--STAT)?   4. What is your medication issue? Bad headache on side of head, missed dose yesterday but started it again today  Please advise

## 2020-01-05 NOTE — Telephone Encounter (Signed)
Left message to call office

## 2020-01-05 NOTE — Telephone Encounter (Signed)
Patient calling back to speak with nurse about medication concern

## 2020-01-05 NOTE — Telephone Encounter (Signed)
Low suspicion Losartan is causing headache. She could try taking at bedtime or start with half tablet for a few days then go to full tablet.   Loel Dubonnet, NP

## 2020-01-06 NOTE — Telephone Encounter (Signed)
I attempted to call the patient. No answer- I left a detailed message on her voice mail of Urban Gibson, NP's recommendations. I asked that she call back to discuss further.

## 2020-01-06 NOTE — Telephone Encounter (Signed)
I spoke with the patient. She states she missed a dose of losartan on 7/20, but then take it 7/21. She did not notice a headache on 7/20, but did resume a dull headache on 7/21.  I have advised the patient of Caitlin's recommendations that she could: 1) try taking losartan at bedtime 2) try taking 1/2 tablet and see how she does, then if no headache, try increasing back to a full tablet once daily  The patient is aware there is low suspicion that the losartan is causing her headache, but we cannot be 417% certain.  I have asked her to call us back if she tries the above options and is still having symptoms. The patient voices understanding and is agreeable.

## 2020-01-23 ENCOUNTER — Other Ambulatory Visit: Payer: Self-pay

## 2020-01-23 ENCOUNTER — Ambulatory Visit (INDEPENDENT_AMBULATORY_CARE_PROVIDER_SITE_OTHER): Payer: No Typology Code available for payment source

## 2020-01-23 DIAGNOSIS — I739 Peripheral vascular disease, unspecified: Secondary | ICD-10-CM

## 2020-01-23 NOTE — Progress Notes (Signed)
Office Visit    Patient Name: Tina Fuller Date of Encounter: 01/25/2020  Primary Care Provider:  Birdie Sons, MD Primary Cardiologist:  Ida Rogue, MD Electrophysiologist:  None   Chief Complaint    Tina Fuller is a 59 y.o. female with a hx of HTN, previous tobacco use, HLD, leg pain presents today for follow-up after initiation of losartan.  Past Medical History    Past Medical History:  Diagnosis Date  . History of chicken pox   . Low back pain   . Palpitations   . Trochanteric bursitis of left hip   . Tubular adenoma of colon 08/2014   Past Surgical History:  Procedure Laterality Date  . BREAST BIOPSY     Benign  . fissurotomy      Allergies  No Known Allergies  History of Present Illness    SHELAGH Fuller is a 59 y.o. female with a hx ofHTN, previous tobacco use, HLD, leg pain. She was last seen 12/28/2019. Family history notable for heart disease and diabetes.   Previously evaluated by Dr. Rockey Situ for tachycardia, chest tightness, palpitations. Previous chest tightness atypical for angina and presumed musculoskeletal. Palpitations self resolved. She has previously reported LE pain but as her pedal pulses were good, imaging was deferred.    Last seen 12/28/19. She was started on Losartan for elevated blood pressure. She reported some headache after initiation which was not a common side effect and was encouraged to take in the evening.   She was also scheduled for lower extremity ABI due to leg pain which were negative for PAD.   Presents today for follow-up. SBP at home routinely 120-130.  She reports no recurrent headache and has been taking her losartan 25 mg daily in the morning.  Pertinent lightheadedness, dizziness, near-syncope, syncope.  She does endorse continued leg pain primarily at night that is intermittent.  We reviewed her normal ABIs and she was reassured.  Encouraged to follow-up with her primary care provider for concern  for RLS.  We reviewed her recent lipid panel with LDL of 113 and goal for lipid-lowering.  Diet and exercise.  She has asthma she has changed her diet and is eating much more fresh fruits and vegetables and less processed food.  Encouraged to continue.  EKGs/Labs/Other Studies Reviewed:   The following studies were reviewed today:  LE Duplex 01/23/20 Summary:  Right: Resting right ankle-brachial index is within normal range. No  evidence of significant right lower extremity arterial disease. The right  toe-brachial index is normal.   Left: Resting left ankle-brachial index is within normal range. No  evidence of significant left lower extremity arterial disease. The left  toe-brachial index is normal.    EKG: No EKG today.  EKG independently reviewed from 12/28/2019 shows normal sinus rhythm 63 bpm with no acute ST/T wave changes.    Recent Labs: 12/28/2019: ALT 11; BUN 11; Creatinine, Ser 0.71; Hemoglobin 12.4; Platelets 368; Potassium 4.7; Sodium 137  Recent Lipid Panel    Component Value Date/Time   CHOL 200 (H) 12/28/2019 1151   TRIG 83 12/28/2019 1151   HDL 72 12/28/2019 1151   CHOLHDL 2.8 12/28/2019 1151   LDLCALC 113 (H) 12/28/2019 1151    Home Medications   Current Meds  Medication Sig  . aspirin EC 81 MG tablet Take 81 mg by mouth daily. Swallow whole.  . losartan (COZAAR) 25 MG tablet Take 1 tablet (25 mg total) by mouth daily.  Marland Kitchen  Multiple Vitamin (MULTIVITAMIN) tablet Take 1 tablet by mouth daily.  . NON FORMULARY Vita Fusion Takes 1 tablet qd.    Review of Systems    Review of Systems  Constitutional: Negative for chills, fever and malaise/fatigue.  Cardiovascular: Negative for chest pain, claudication, dyspnea on exertion, leg swelling, near-syncope, orthopnea, palpitations and syncope.  Respiratory: Negative for cough, shortness of breath and wheezing.   Musculoskeletal:       (+) LLE pain at night intermittently  Gastrointestinal: Negative for nausea and  vomiting.  Neurological: Negative for dizziness, light-headedness and weakness.   All other systems reviewed and are otherwise negative except as noted above.  Physical Exam    VS:  BP 134/82 (BP Location: Left Arm, Patient Position: Sitting, Cuff Size: Normal)   Pulse 85   Ht 5\' 8"  (1.727 m)   Wt 209 lb 6 oz (95 kg)   SpO2 99%   BMI 31.84 kg/m  , BMI Body mass index is 31.84 kg/m. GEN: Well nourished, overweight, well developed, in no acute distress. HEENT: normal. Neck: Supple, no JVD, carotid bruits, or masses. Cardiac: RRR, no murmurs, rubs, or gallops. No clubbing, cyanosis, edema.  Radials/DP/PT 2+ and equal bilaterally.  Respiratory:  Respirations regular and unlabored, clear to auscultation bilaterally. GI: Soft, nontender, nondistended. MS: No deformity or atrophy. Skin: Warm and dry, no rash. Neuro:  Strength and sensation are intact. Psych: Normal affect.  Assessment & Plan   1. HTN -BP at goal since initiation of losartan 25 mg daily.  She will continue losartan 25 mg daily.  Continue home monitoring with goal BP less than 130/80.   Recommend regular cardiovascular exercise.  Recommend low-sodium, healthy diet.   2. LE pain / Venous insufficiency - ABI 01/2020 with no evidence of PAD.  She was reassured by the result.  Likely cramps versus venous insufficiency vs RLS.  Recent electrolytes were normal.  She reports the pain is worse at night in her left leg.  Encouraged to follow-up with PCP.  3. HLD - Not presently on statin. 12/28/19 with total cholesterol 200, HDL 72, triglycerides 83, LDL 113. Lipid lowering diet recommended.  Provided dietary education during clinic visit as well as in handout.  Disposition: Follow up in 6 month(s) with Dr. Rockey Situ or APP.   Loel Dubonnet, NP 01/25/2020, 11:12 AM

## 2020-01-25 ENCOUNTER — Ambulatory Visit (INDEPENDENT_AMBULATORY_CARE_PROVIDER_SITE_OTHER): Payer: No Typology Code available for payment source | Admitting: Family

## 2020-01-25 ENCOUNTER — Encounter: Payer: Self-pay | Admitting: Family

## 2020-01-25 ENCOUNTER — Other Ambulatory Visit: Payer: Self-pay

## 2020-01-25 VITALS — BP 134/82 | HR 85 | Ht 68.0 in | Wt 209.4 lb

## 2020-01-25 DIAGNOSIS — I10 Essential (primary) hypertension: Secondary | ICD-10-CM

## 2020-01-25 DIAGNOSIS — E782 Mixed hyperlipidemia: Secondary | ICD-10-CM | POA: Diagnosis not present

## 2020-01-25 NOTE — Patient Instructions (Addendum)
Medication Instructions:  NO medication changes today  *If you need a refill on your cardiac medications before your next appointment, please call your pharmacy*  Lab Work: No lab work today.   Testing/Procedures: Your ultrasound of your lower extremities showed no evidence of blockages in your legs. This is a great result!  Follow-Up: At [facility], you and your health needs are our priority.  As part of our continuing mission to provide you with exceptional heart care, we have created designated Provider Care Teams.  These Care Teams include your primary Cardiologist (physician) and Advanced Practice Providers (APPs -  Physician Assistants and Nurse Practitioners) who all work together to provide you with the care you need, when you need it.  We recommend signing up for the patient portal called "MyChart".  Sign up information is provided on this After Visit Summary.  MyChart is used to connect with patients for Virtual Visits (Telemedicine).  Patients are able to view lab/test results, encounter notes, upcoming appointments, etc.  Non-urgent messages can be sent to your provider as well.   To learn more about what you can do with MyChart, go to [email or web address].    Your next appointment:   6 month(s)  The format for your next appointment:   In Person  Provider:    You may see Ida Rogue, MD or one of the following Advanced Practice Providers on your designated Care Team:    Murray Hodgkins, NP  Christell Faith, PA-C  Marrianne Mood, PA-C  Laurann Montana, NP  Other Instructions   Your blood pressure looks great! Our goal is for it to be consistently less than 130/80 when checked at home.   Fat and Cholesterol Restricted Eating Plan Getting too much fat and cholesterol in your diet may cause health problems. Choosing the right foods helps keep your fat and cholesterol at normal levels. This can keep you from getting certain diseases.  What are tips for  following this plan? Meal planning  At meals, divide your plate into four equal parts: ? Fill one-half of your plate with vegetables and green salads. ? Fill one-fourth of your plate with whole grains. ? Fill one-fourth of your plate with low-fat (lean) protein foods.  Eat fish that is high in omega-3 fats at least two times a week. This includes mackerel, tuna, sardines, and salmon.  Eat foods that are high in fiber, such as whole grains, beans, apples, broccoli, carrots, peas, and barley. General tips   Work with your doctor to lose weight if you need to.  Avoid: ? Foods with added sugar. ? Fried foods. ? Foods with partially hydrogenated oils.  Limit alcohol intake to no more than 1 drink a day for nonpregnant women and 2 drinks a day for men. One drink equals 12 oz of beer, 5 oz of wine, or 1 oz of hard liquor. Reading food labels  Check food labels for: ? Trans fats. ? Partially hydrogenated oils. ? Saturated fat (g) in each serving. ? Cholesterol (mg) in each serving. ? Fiber (g) in each serving.  Choose foods with healthy fats, such as: ? Monounsaturated fats. ? Polyunsaturated fats. ? Omega-3 fats.  Choose grain products that have whole grains. Look for the word "whole" as the first word in the ingredient list. Cooking  Cook foods using low-fat methods. These include baking, boiling, grilling, and broiling.  Eat more home-cooked foods. Eat at restaurants and buffets less often.  Avoid cooking using saturated fats, such as butter, cream, palm  oil, palm kernel oil, and coconut oil. Recommended foods  Fruits  All fresh, canned (in natural juice), or frozen fruits. Vegetables  Fresh or frozen vegetables (raw, steamed, roasted, or grilled). Green salads. Grains  Whole grains, such as whole wheat or whole grain breads, crackers, cereals, and pasta. Unsweetened oatmeal, bulgur, barley, quinoa, or brown rice. Corn or whole wheat flour tortillas. Meats and other  protein foods  Ground beef (85% or leaner), grass-fed beef, or beef trimmed of fat. Skinless chicken or [place]. Ground chicken or [place]. Pork trimmed of fat. All fish and seafood. Egg whites. Dried beans, peas, or lentils. Unsalted nuts or seeds. Unsalted canned beans. Nut butters without added sugar or oil. Dairy  Low-fat or nonfat dairy products, such as skim or 1% milk, 2% or reduced-fat cheeses, low-fat and fat-free ricotta or cottage cheese, or plain low-fat and nonfat yogurt. Fats and oils  Tub margarine without trans fats. Light or reduced-fat mayonnaise and salad dressings. Avocado. Olive, canola, sesame, or safflower oils. The items listed above may not be a complete list of foods and beverages you can eat. Contact a dietitian for more information. Foods to avoid Fruits  Canned fruit in heavy syrup. Fruit in cream or butter sauce. Fried fruit. Vegetables  Vegetables cooked in cheese, cream, or butter sauce. Fried vegetables. Grains  White bread. White pasta. White rice. Cornbread. Bagels, pastries, and croissants. Crackers and snack foods that contain trans fat and hydrogenated oils. Meats and other protein foods  Fatty cuts of meat. Ribs, chicken wings, bacon, sausage, bologna, salami, chitterlings, fatback, hot dogs, bratwurst, and packaged lunch meats. Liver and organ meats. Whole eggs and egg yolks. Chicken and [place] with skin. Fried meat. Dairy  Whole or 2% milk, cream, half-and-half, and cream cheese. Whole milk cheeses. Whole-fat or sweetened yogurt. Full-fat cheeses. Nondairy creamers and whipped toppings. Processed cheese, cheese spreads, and cheese curds. Beverages  Alcohol. Sugar-sweetened drinks such as sodas, lemonade, and fruit drinks. Fats and oils  Butter, stick margarine, lard, shortening, ghee, or bacon fat. Coconut, palm kernel, and palm oils. Sweets and desserts  Corn syrup, sugars, honey, and molasses. Candy. Jam and jelly. Syrup. Sweetened cereals.  Cookies, pies, cakes, donuts, muffins, and ice cream. The items listed above may not be a complete list of foods and beverages you should avoid. Contact a dietitian for more information. Summary  Choosing the right foods helps keep your fat and cholesterol at normal levels. This can keep you from getting certain diseases.  At meals, fill one-half of your plate with vegetables and green salads.  Eat high-fiber foods, like whole grains, beans, apples, carrots, peas, and barley.  Limit added sugar, saturated fats, alcohol, and fried foods. This information is not intended to replace advice given to you by your health care provider. Make sure you discuss any questions you have with your health care provider. Document Revised: 02/03/2018 Document Reviewed: 02/17/2017 Elsevier Patient Education  [place].

## 2020-02-10 ENCOUNTER — Telehealth: Payer: Self-pay | Admitting: Family Medicine

## 2020-02-10 NOTE — Telephone Encounter (Signed)
[employer] form being sent back for completion.

## 2020-02-15 NOTE — Telephone Encounter (Signed)
Per Dr. Caryn Section, this form requires an office visit with vital signs and waist circumference. Patient has an appointment for a CPE scheduled 03/16/2020 at 2pm.  I tried calling patient to see if she wanted to wait until her CPE on 03/16/2020 to have the form filled out? If she needs the form filled out before 03/16/2020 we can move her CPE appointment up to 03/02/2020 at 2pm. I have placed an appointment on hold for her in case she wants to move the appointment up.  Left message to call back. OK for [facility] triage to advise and route message back to office with patient's response.

## 2020-02-16 ENCOUNTER — Telehealth: Payer: Self-pay | Admitting: Family Medicine

## 2020-02-16 NOTE — Telephone Encounter (Addendum)
See telephone encounter on 02/16/20.

## 2020-02-16 NOTE — Telephone Encounter (Signed)
Patient called back and was read note by Dr Caryn Section 02/10/20.  Patient states that she needs forms before her scheduled CPE 03/16/20 and would like to have appointment that was place on hold for her for CPE 9/17 21 at 2pm.  I spoke with office and will route note for the reschedule per Dr Caryn Section note.

## 2020-03-02 ENCOUNTER — Other Ambulatory Visit: Payer: Self-pay

## 2020-03-02 ENCOUNTER — Ambulatory Visit (INDEPENDENT_AMBULATORY_CARE_PROVIDER_SITE_OTHER): Payer: No Typology Code available for payment source | Admitting: Family Medicine

## 2020-03-02 ENCOUNTER — Encounter: Payer: Self-pay | Admitting: Family Medicine

## 2020-03-02 VITALS — BP 134/84 | HR 78 | Temp 98.6°F | Resp 16 | Ht 68.0 in | Wt 210.0 lb

## 2020-03-02 DIAGNOSIS — Z1211 Encounter for screening for malignant neoplasm of colon: Secondary | ICD-10-CM | POA: Diagnosis not present

## 2020-03-02 DIAGNOSIS — I10 Essential (primary) hypertension: Secondary | ICD-10-CM | POA: Insufficient documentation

## 2020-03-02 DIAGNOSIS — E669 Obesity, unspecified: Secondary | ICD-10-CM

## 2020-03-02 DIAGNOSIS — Z0000 Encounter for general adult medical examination without abnormal findings: Secondary | ICD-10-CM | POA: Diagnosis not present

## 2020-03-02 DIAGNOSIS — M79671 Pain in right foot: Secondary | ICD-10-CM

## 2020-03-02 DIAGNOSIS — Z86010 Personal history of colonic polyps: Secondary | ICD-10-CM

## 2020-03-02 DIAGNOSIS — Z23 Encounter for immunization: Secondary | ICD-10-CM | POA: Diagnosis not present

## 2020-03-02 DIAGNOSIS — E782 Mixed hyperlipidemia: Secondary | ICD-10-CM | POA: Diagnosis not present

## 2020-03-02 MED ORDER — TOPIRAMATE 50 MG PO TABS: ORAL_TABLET | ORAL | 1 refills | Status: DC

## 2020-03-02 MED ORDER — PHENTERMINE HCL 15 MG PO CAPS: 15.0000 mg | ORAL_CAPSULE | ORAL | 1 refills | Status: DC

## 2020-03-02 NOTE — Progress Notes (Signed)
I,Roshena L Chambers,acting as a scribe for Lelon Huh, MD.,have documented all relevant documentation on the behalf of Lelon Huh, MD,as directed by  Lelon Huh, MD while in the presence of Lelon Huh, MD.  Complete physical exam   Patient: Tina Fuller   DOB: [birth year]   59 y.o. Female  MRN: [id number] Visit Date: 03/02/2020  Today's healthcare provider: Lelon Huh, MD   Chief Complaint  Patient presents with  . Annual Exam   Subjective    Tina Fuller is a 59 y.o. female who presents today for a complete physical exam.  She reports consuming a general diet. The patient does not participate in regular exercise at present. She generally feels fairly well. She reports sleeping poorly. She does have additional problems to discuss today.    Last pap: 07/02/2017 Normal; HPV negative Last Mammogram: 03/08/2019- Normal/ Bi-RADS CAT 2  HPI  Hypertension, follow-up  BP Readings from Last 3 Encounters:  03/02/20 134/84  01/25/20 134/82  12/28/19 (!) 150/82   Wt Readings from Last 3 Encounters:  03/02/20 210 lb (95.3 kg)  01/25/20 209 lb 6 oz (95 kg)  12/28/19 209 lb (94.8 kg)     This problem is managed by Cardiology. Patient will follow up with Cardiology in 6 months.  Home blood pressure readings average 130/70's.  Patient reports good tolerance with treatment.   Symptoms: No chest pain No chest pressure  No palpitations No syncope  No dyspnea No orthopnea  No paroxysmal nocturnal dyspnea No lower extremity edema   Pertinent labs: Lab Results  Component Value Date   CHOL 200 (H) 12/28/2019   HDL 72 12/28/2019   LDLCALC 113 (H) 12/28/2019   TRIG 83 12/28/2019   CHOLHDL 2.8 12/28/2019   Lab Results  Component Value Date   NA 137 12/28/2019   K 4.7 12/28/2019   CREATININE 0.71 12/28/2019   GFRNONAA 96 12/28/2019   GFRAA 110 12/28/2019   GLUCOSE 89 12/28/2019     The 10-year ASCVD risk score Mikey Bussing DC Jr., et al., 2013) is: 4.8%     ---------------------------------------------------------------------------------------------------  Lipid/Cholesterol, Follow-up  Last lipid panel Other pertinent labs  Lab Results  Component Value Date   CHOL 200 (H) 12/28/2019   HDL 72 12/28/2019   LDLCALC 113 (H) 12/28/2019   TRIG 83 12/28/2019   CHOLHDL 2.8 12/28/2019   Lab Results  Component Value Date   ALT 11 12/28/2019   AST 15 12/28/2019   PLT 368 12/28/2019   TSH 0.657 07/02/2017     This problem is managed by Cardiology.   Symptoms: No chest pain No chest pressure/discomfort  No dyspnea No lower extremity edema  No numbness or tingling of extremity No orthopnea  No palpitations No paroxysmal nocturnal dyspnea  No speech difficulty No syncope   Current diet: well balanced Current exercise: none  The 10-year ASCVD risk score Mikey Bussing DC Jr., et al., 2013) is: 4.8%  --------------------------------------------------------------------------------------------------- She also requests prescription to help lose weight. She states she does walk a lot with her job and stays active. She try to eat healthy but she is hungry all the time when she tries to eat less. She has tried OTC appetite suppressants which made her a little jittery.    She also complains of persistent pain her feet with  Bunion on her right foot. She requests referral to podiatry.    Past Medical History:  Diagnosis Date  . History of chicken pox   . Low back  pain   . Muscle spasms of neck 08/19/2017  . Palpitations   . Trochanteric bursitis of left hip   . Trochanteric bursitis of left hip 05/21/2015  . Tubular adenoma of colon 08/2014   Past Surgical History:  Procedure Laterality Date  . BREAST BIOPSY     Benign  . fissurotomy     Social History   Socioeconomic History  . Marital status: Married    Spouse name: Not on file  . Number of children: 2  . Years of education: GED  . Highest education level: Not on file  Occupational  History  . 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 Smoking status: Former Smoker    Packs/day: 0.25    Years: 8.00    Pack years: 2.00  . Smokeless tobacco: Never Used  . Tobacco comment: smoked less than 1/2 ppd for 5-10 years   Substance and Sexual Activity  . Alcohol use: Yes    Comment: occasional   . Drug use: No  . Sexual activity: Not on file  Other Topics Concern  . Not on file  Social History Narrative   Divorced; full time; exercises regularly - walking    Social Determinants of Health   Financial Resource Strain:   . Difficulty of Paying Living Expenses: Not on file  Food Insecurity:   . Worried About Charity fundraiser in the Last Year: Not on file  . Ran Out of Food in the Last Year: Not on file  Transportation Needs:   . Lack of Transportation (Medical): Not on file  . Lack of Transportation (Non-Medical): Not on file  Physical Activity:   . Days of Exercise per Week: Not on file  . Minutes of Exercise per Session: Not on file  Stress:   . Feeling of Stress : Not on file  Social Connections:   . Frequency of Communication with Friends and Family: Not on file  . Frequency of Social Gatherings with Friends and Family: Not on file  . Attends Religious Services: Not on file  . Active Member of Clubs or Organizations: Not on file  . Attends Archivist Meetings: Not on file  . Marital Status: Not on file  Intimate Partner Violence:   . Fear of Current or Ex-Partner: Not on file  . Emotionally Abused: Not on file  . Physically Abused: Not on file  . Sexually Abused: Not on file   Family Status  Relation Name Status  . Father  Deceased at age 76       heart disease and CA  . Mother  Deceased at age 58       HTN; non-insulin dependent diabetes, alzheimers  . Brother  Alive       CHF and stroke  . Brother  Alive       CAD in 31s  . Sister  Deceased       CAD with a stent at 92   .  Sister  Alive   Family History  Problem Relation Age of Onset  . Heart failure Father   . Hyperlipidemia Father   . Hypertension Father   . Cerebrovascular Accident Father   . Hypertension Mother   . Hyperlipidemia Mother   . Diabetes Mother   . Alzheimer's disease Mother   . Cerebrovascular Accident Mother   . Heart disease Brother   . Hypertension Brother   . Hyperlipidemia  Brother   . Diabetes Brother   . Heart disease Brother   . Hyperlipidemia Brother   . Hypertension Brother   . Heart disease Sister   . Diabetes Sister   . Heart failure Sister   . Heart failure Sister    No Known Allergies  Patient Care Team: Birdie Sons, MD as PCP - General (Family Medicine) Rockey Situ, Kathlene November, MD as PCP - Cardiology (Cardiology) Manus Rudd, NP as Nurse Practitioner (Nurse Practitioner) Minna Merritts, MD as Consulting Physician (Cardiology) Odette Fraction [facility])   Medications: Outpatient Medications Prior to Visit  Medication Sig  . aspirin EC 81 MG tablet Take 81 mg by mouth daily. Swallow whole.  . losartan (COZAAR) 25 MG tablet Take 1 tablet (25 mg total) by mouth daily.  . Multiple Vitamin (MULTIVITAMIN) tablet Take 1 tablet by mouth daily.  . NON FORMULARY Vita Fusion Takes 1 tablet qd.   No facility-administered medications prior to visit.    Review of Systems  Constitutional: Negative for chills, fatigue and fever.  HENT: Positive for sinus pressure and sneezing. Negative for congestion, ear pain, rhinorrhea and sore throat.   Eyes: Negative.  Negative for pain and redness.  Respiratory: Negative for cough, shortness of breath and wheezing.   Cardiovascular: Positive for leg swelling. Negative for chest pain.  Gastrointestinal: Positive for diarrhea and nausea. Negative for abdominal pain, blood in stool and constipation.       Mucus in stool 6 months ago  Endocrine: Negative for polydipsia and polyphagia.  Genitourinary: Positive for urgency.  Negative for dysuria, flank pain, hematuria, pelvic pain, vaginal bleeding and vaginal discharge.  Musculoskeletal: Positive for arthralgias, back pain, joint swelling and myalgias. Negative for gait problem.       Right foot pain/ bunion  Skin: Positive for rash.  Neurological: Negative.  Negative for dizziness, tremors, seizures, weakness, light-headedness, numbness and headaches.  Hematological: Negative for adenopathy.  Psychiatric/Behavioral: Negative.  Negative for behavioral problems, confusion and dysphoric mood. The patient is not nervous/anxious and is not hyperactive.       Objective    BP 134/84 (BP Location: Right Arm, Patient Position: Sitting, Cuff Size: Large)   Pulse 78   Temp 98.6 F (37 C) (Oral)   Resp 16   Ht 5\' 8"  (1.727 m)   Wt 210 lb (95.3 kg)   BMI 31.93 kg/m    Physical Exam   General Appearance:    Mildly obese female. Alert, cooperative, in no acute distress, appears stated age   Head:    Normocephalic, without obvious abnormality, atraumatic  Eyes:    PERRL, conjunctiva/corneas clear, EOM's intact, fundi    benign, both eyes  Ears:    Normal TM's and external ear canals, both ears  Neck:   Supple, symmetrical, trachea midline, no adenopathy;    thyroid:  no enlargement/tenderness/nodules; no carotid   bruit or JVD  Back:     Symmetric, no curvature, ROM normal, no CVA tenderness  Lungs:     Clear to auscultation bilaterally, respirations unlabored  Chest Wall:    No tenderness or deformity   Heart:    Normal heart rate. Normal rhythm. No murmurs, rubs, or gallops.   Breast Exam:    normal appearance, no masses or tenderness  Abdomen:     Soft, non-tender, bowel sounds active all four quadrants,    no masses, no organomegaly  Pelvic:    deferred  Extremities:   All extremities are intact. No cyanosis  or edema. Bilateral bunions, Right > Left  Pulses:   2+ and symmetric all extremities  Skin:   Skin color, texture, turgor normal, no rashes or  lesions  Lymph nodes:   Cervical, supraclavicular, and axillary nodes normal  Neurologic:   CNII-XII intact, normal strength, sensation and reflexes    throughout    Last depression screening scores PHQ 2/9 Scores 03/02/2020 09/01/2018 07/02/2017  PHQ - 2 Score 1 0 0  PHQ- 9 Score - 2 0   Last fall risk screening Fall Risk  03/02/2020  Falls in the past year? 0  Number falls in past yr: 0  Injury with Fall? 0  Follow up Falls evaluation completed   Last Audit-C alcohol use screening Alcohol Use Disorder Test (AUDIT) 03/02/2020  1. How often do you have a drink containing alcohol? 2  2. How many drinks containing alcohol do you have on a typical day when you are drinking? 0  3. How often do you have six or more drinks on one occasion? 0  AUDIT-C Score 2  Alcohol Brief Interventions/Follow-up AUDIT Score <7 follow-up not indicated   A score of 3 or more in women, and 4 or more in men indicates increased risk for alcohol abuse, EXCEPT if all of the points are from question 1   No results found for any visits on 03/02/20.  Assessment & Plan    Routine Health Maintenance and Physical Exam  Exercise Activities and Dietary recommendations Goals   None     Immunization History  Administered Date(s) Administered  . PFIZER SARS-COV-2 Vaccination 09/10/2019, 10/08/2019  . Td 07/02/2017  . Tdap 11/18/2006  . Zoster Recombinat (Shingrix) 07/02/2017, 09/14/2017    Health Maintenance  Topic Date Due  . HIV Screening  Never done  . COLONOSCOPY  08/29/2019  . INFLUENZA VACCINE  Never done  . MAMMOGRAM  03/07/2021  . PAP SMEAR-Modifier  07/02/2022  . TETANUS/TDAP  07/03/2027  . COVID-19 Vaccine  Completed  . Hepatitis C Screening  Completed    Discussed health benefits of physical activity, and encouraged her to engage in regular exercise appropriate for her age and condition.  1. Annual physical exam   2. Essential hypertension Was started on losartan by cardiology and does  not have follow up until February. She does report home SBPs mostly in the 120s. Will continue current dose of losartan for now and she is going to work on losing weight and sodium avoidance.   3. Obesity (BMI 30-39.9) She would like to try prescription medications. Discussed various weight loss meds. Advised of potential adverse effects. Will try - phentermine 15 MG capsule; Take 1 capsule (15 mg total) by mouth every morning.  And topiramate (TOPAMAX) 50 MG tablet; 1/2 tablet every morning for 8 days, then increase to 1 tablet every morning  Dispense: 30 tablet; Refill: 1  Is to return in about 6 weeks for follow up weight and BP check, call if any adverse effects.   4. Mixed hyperlipidemia The 10-year ASCVD risk score Mikey Bussing DC Jr., et al., 2013) is: 4.8% statins not indicated, but will work on BP control.   5. Right foot pain Bunions noted.  - Ambulatory referral to Podiatry  6. Need for influenza vaccination  - Flu Vaccine QUAD 36+ mos IM (Fluarix/Fluzone)  7. Personal history of colonic polyps Due for follow up - Ambulatory referral to gastroenterology for colonoscopy  8. Colon cancer screening  - Ambulatory referral to gastroenterology for colonoscopy  The entirety of the information documented in the History of Present Illness, Review of Systems and Physical Exam were personally obtained by me. Portions of this information were initially documented by the CMA and reviewed by me for thoroughness and accuracy.      Lelon Huh, MD  [facility] [phone] (phone) [phone] (fax)  [place]

## 2020-03-16 ENCOUNTER — Encounter: Payer: No Typology Code available for payment source | Admitting: Family Medicine

## 2020-03-21 ENCOUNTER — Telehealth: Payer: Self-pay

## 2020-03-21 ENCOUNTER — Other Ambulatory Visit: Payer: Self-pay

## 2020-03-21 ENCOUNTER — Telehealth: Payer: No Typology Code available for payment source

## 2020-03-21 ENCOUNTER — Telehealth (INDEPENDENT_AMBULATORY_CARE_PROVIDER_SITE_OTHER): Payer: No Typology Code available for payment source | Admitting: Gastroenterology

## 2020-03-21 DIAGNOSIS — Z86010 Personal history of colonic polyps: Secondary | ICD-10-CM

## 2020-03-21 MED ORDER — PEG 3350-KCL-NA BICARB-NACL 420 G PO SOLR: 4000.0000 mL | Freq: Once | ORAL | 0 refills | Status: AC

## 2020-03-21 NOTE — Telephone Encounter (Signed)
Contacted patient to triage and schedule colonoscopy this morning, I was unable to reach her.  I LVM at 9:30am and again at 9:45am.  I asked her to please call the office to reschedule her nurse call.  Thanks,  [place], [place]

## 2020-03-21 NOTE — Progress Notes (Signed)
Gastroenterology Pre-Procedure Review  Request Date: 04/17/20 Requesting Physician: Dr. Allen Norris  PATIENT REVIEW QUESTIONS: The patient responded to the following health history questions as indicated:    1. Are you having any GI issues? Mucus in stool 6-7 months ago 2. Do you have a personal history of Polyps? yes (12/09/16 colon polyps noted.) 3. Do you have a family history of Colon Cancer or Polyps? no 4. Diabetes Mellitus? no 5. Joint replacements in the past 12 months?no 6. Major health problems in the past 3 months?no 7. Any artificial heart valves, MVP, or defibrillator?no    MEDICATIONS & ALLERGIES:    Patient reports the following regarding taking any anticoagulation/antiplatelet therapy:   Plavix, Coumadin, Eliquis, Xarelto, Lovenox, Pradaxa, Brilinta, or Effient? no Aspirin? 81 mg daily  Patient confirms/reports the following medications:  Current Outpatient Medications  Medication Sig Dispense Refill   aspirin EC 81 MG tablet Take 81 mg by mouth daily. Swallow whole.     losartan (COZAAR) 25 MG tablet Take 1 tablet (25 mg total) by mouth daily. 90 tablet 3   Multiple Vitamin (MULTIVITAMIN) tablet Take 1 tablet by mouth daily.     Naproxen Sodium (ALEVE PO) Take by mouth. PRN     NON FORMULARY Vita Fusion Takes 1 tablet qd.     phentermine 15 MG capsule Take 1 capsule (15 mg total) by mouth every morning. 30 capsule 1   topiramate (TOPAMAX) 50 MG tablet 1/2 tablet every morning for 8 days, then increase to 1 tablet every morning 30 tablet 1   polyethylene glycol-electrolytes (NULYTELY) 420 g solution Take 4,000 mLs by mouth once for 1 dose. 4000 mL 0   No current facility-administered medications for this visit.    Patient confirms/reports the following allergies:  No Known Allergies  Orders Placed This Encounter  Procedures   Procedural/ Surgical Case Request: COLONOSCOPY WITH PROPOFOL    Standing Status:   Standing    Number of Occurrences:   1    Order  Specific Question:   Pre-op diagnosis    Answer:   personal history of colon polyps    Order Specific Question:   CPT Code    Answer:   71245    AUTHORIZATION INFORMATION Primary Insurance: 1D#: Group #:  Secondary Insurance: 1D#: Group #:  SCHEDULE INFORMATION: Date: 04/17/20 Time: Location:[facility]

## 2020-04-04 ENCOUNTER — Ambulatory Visit (INDEPENDENT_AMBULATORY_CARE_PROVIDER_SITE_OTHER): Payer: No Typology Code available for payment source | Admitting: Family Medicine

## 2020-04-04 ENCOUNTER — Encounter: Payer: Self-pay | Admitting: Family Medicine

## 2020-04-04 ENCOUNTER — Other Ambulatory Visit: Payer: Self-pay

## 2020-04-04 DIAGNOSIS — E669 Obesity, unspecified: Secondary | ICD-10-CM | POA: Diagnosis not present

## 2020-04-04 MED ORDER — TOPIRAMATE 50 MG PO TABS: 50.0000 mg | ORAL_TABLET | Freq: Every day | ORAL | 3 refills | Status: DC

## 2020-04-04 MED ORDER — PHENTERMINE HCL 15 MG PO CAPS: 15.0000 mg | ORAL_CAPSULE | ORAL | 3 refills | Status: DC

## 2020-04-04 NOTE — Progress Notes (Signed)
Established patient visit   Patient: Tina Fuller   DOB: 01-28-1961   59 y.o. Female  MRN: [id number] Visit Date: 04/04/2020  Today's healthcare provider: Lelon Huh, MD   No chief complaint on file.  Subjective    HPI   Follow up for obesity  The patient was last seen for this 4 weeks ago. Changes made at last visit include starting topiramate and phentermine.  She reports excellent compliance with treatment. She feels that condition is Improved. She is not having side effects.   Wt Readings from Last 3 Encounters:  04/04/20 200 lb (90.7 kg)  03/02/20 210 lb (95.3 kg)  01/25/20 209 lb 6 oz (95 kg)    -----------------------------------------------------------------------------------------      Medications: Outpatient Medications Prior to Visit  Medication Sig  . aspirin EC 81 MG tablet Take 81 mg by mouth daily. Swallow whole.  . Multiple Vitamin (MULTIVITAMIN) tablet Take 1 tablet by mouth daily.  . Naproxen Sodium (ALEVE PO) Take by mouth. PRN  . NON FORMULARY Vita Fusion Takes 1 tablet qd.  . phentermine 15 MG capsule Take 1 capsule (15 mg total) by mouth every morning.  . topiramate (TOPAMAX) 50 MG tablet 1/2 tablet every morning for 8 days, then increase to 1 tablet every morning  . losartan (COZAAR) 25 MG tablet Take 1 tablet (25 mg total) by mouth daily.   No facility-administered medications prior to visit.    Review of Systems  Constitutional: Negative for appetite change, chills, fatigue and fever.  Respiratory: Negative for chest tightness and shortness of breath.   Cardiovascular: Negative for chest pain and palpitations.  Gastrointestinal: Negative for abdominal pain, nausea and vomiting.  Neurological: Negative for dizziness and weakness.      Objective    BP 139/86 (BP Location: Right Arm, Patient Position: Sitting, Cuff Size: Large)   Pulse 82   Temp 98.3 F (36.8 C) (Oral)   Wt 200 lb (90.7 kg)   BMI 30.41 kg/m     Physical Exam   General appearance: Obese female, cooperative and in no acute distress Head: Normocephalic, without obvious abnormality, atraumatic Respiratory: Respirations even and unlabored, normal respiratory rate Extremities: All extremities are intact.  Skin: Skin color, texture, turgor normal. No rashes seen  Psych: Appropriate mood and affect. Neurologic: Mental status: Alert, oriented to person, place, and time, thought content appropriate.   No results found for any visits on 04/04/20.  Assessment & Plan     1. Obesity (BMI 30-39.9) Doing well with 10 pound weight loss in the last month when topiramate and phentermine were started. Continue current medications.  Goal is got get weight un 175. Continue regular exercise average of 30 minutes per day. refill - topiramate (TOPAMAX) 50 MG tablet; Take 1 tablet (50 mg total) by mouth daily.  Dispense: 30 tablet; Refill: 3 - phentermine 15 MG capsule; Take 1 capsule (15 mg total) by mouth every morning.  Dispense: 30 capsule; Refill: 3   Future Appointments  Date Time Provider [place]  06/27/2020  9:40 AM Birdie Sons, MD [facility]  07/30/2020  8:00 AM Rockey Situ, Kathlene November, MD [id number]         The entirety of the information documented in the History of Present Illness, Review of Systems and Physical Exam were personally obtained by me. Portions of this information were initially documented by the CMA and reviewed by me for thoroughness and accuracy.      Lelon Huh, MD  [place] [phone] (phone) [phone] (fax)  [place]

## 2020-04-12 ENCOUNTER — Other Ambulatory Visit: Payer: Self-pay

## 2020-04-12 ENCOUNTER — Telehealth: Payer: Self-pay | Admitting: Gastroenterology

## 2020-04-12 DIAGNOSIS — Z86010 Personal history of colonic polyps: Secondary | ICD-10-CM

## 2020-04-12 MED ORDER — GOLYTELY 236 G PO SOLR: 4000.0000 mL | Freq: Once | ORAL | 0 refills | Status: DC

## 2020-04-12 MED ORDER — GOLYTELY 236 G PO SOLR: 4000.0000 mL | Freq: Once | ORAL | 0 refills | Status: AC

## 2020-04-12 NOTE — Telephone Encounter (Signed)
Patient states she went to [facility] yesterday to pick up prep for colon on Tues 11.2.21. Pt states the pharmacy did not have the prep and would like it called in again for her to pick up today. Pt had phone screening on 10.6.21.

## 2020-04-13 ENCOUNTER — Telehealth: Payer: Self-pay

## 2020-04-13 ENCOUNTER — Other Ambulatory Visit
Admission: RE | Admit: 2020-04-13 | Discharge: 2020-04-13 | Disposition: A | Discharge status: Home / Self Care | Payer: No Typology Code available for payment source | Source: Ambulatory Visit | Attending: Gastroenterology | Admitting: Gastroenterology

## 2020-04-13 ENCOUNTER — Other Ambulatory Visit: Payer: Self-pay

## 2020-04-13 DIAGNOSIS — Z20822 Contact with and (suspected) exposure to covid-19: Secondary | ICD-10-CM | POA: Insufficient documentation

## 2020-04-13 DIAGNOSIS — Z01812 Encounter for preprocedural laboratory examination: Secondary | ICD-10-CM | POA: Diagnosis not present

## 2020-04-13 NOTE — Telephone Encounter (Signed)
Copied from [place] [phone]. Topic: Referral - Status >> Apr 13, 2020  2:12 PM Lennox Solders wrote: Reason for CRM: pt is returning sarah call concerning a referral

## 2020-04-14 LAB — SARS CORONAVIRUS 2 (TAT 6-24 HRS): SARS Coronavirus 2: NEGATIVE

## 2020-04-17 ENCOUNTER — Ambulatory Visit
Admission: RE | Admit: 2020-04-17 | Discharge: 2020-04-17 | Disposition: A | Discharge status: Home / Self Care | Payer: No Typology Code available for payment source | Attending: Gastroenterology | Admitting: Gastroenterology

## 2020-04-17 ENCOUNTER — Ambulatory Visit: Payer: No Typology Code available for payment source

## 2020-04-17 ENCOUNTER — Encounter: Payer: Self-pay | Admitting: Gastroenterology

## 2020-04-17 ENCOUNTER — Encounter
Admission: RE | Disposition: A | Discharge status: Home / Self Care | Payer: Self-pay | Source: Home / Self Care | Attending: Gastroenterology

## 2020-04-17 ENCOUNTER — Other Ambulatory Visit: Payer: Self-pay

## 2020-04-17 DIAGNOSIS — Z86010 Personal history of colonic polyps: Secondary | ICD-10-CM | POA: Diagnosis present

## 2020-04-17 DIAGNOSIS — D122 Benign neoplasm of ascending colon: Secondary | ICD-10-CM | POA: Diagnosis not present

## 2020-04-17 DIAGNOSIS — Z87891 Personal history of nicotine dependence: Secondary | ICD-10-CM | POA: Diagnosis not present

## 2020-04-17 DIAGNOSIS — Z1211 Encounter for screening for malignant neoplasm of colon: Secondary | ICD-10-CM | POA: Insufficient documentation

## 2020-04-17 HISTORY — PX: COLONOSCOPY WITH PROPOFOL: SHX5780

## 2020-04-17 LAB — POCT PREGNANCY, URINE: Preg Test, Ur: NEGATIVE

## 2020-04-17 SURGERY — COLONOSCOPY WITH PROPOFOL
Anesthesia: General

## 2020-04-17 MED ORDER — LIDOCAINE HCL (CARDIAC) PF 100 MG/5ML IV SOSY
PREFILLED_SYRINGE | INTRAVENOUS | Status: DC | PRN
  Administered 2020-04-17: 50 mg via INTRAVENOUS

## 2020-04-17 MED ORDER — PROPOFOL 500 MG/50ML IV EMUL
INTRAVENOUS | Status: DC | PRN
  Administered 2020-04-17: 130 ug/kg/min via INTRAVENOUS

## 2020-04-17 MED ORDER — SODIUM CHLORIDE 0.9 % IV SOLN: INTRAVENOUS | Status: DC

## 2020-04-17 MED ORDER — PROPOFOL 10 MG/ML IV BOLUS
INTRAVENOUS | Status: DC | PRN
  Administered 2020-04-17: 80 mg via INTRAVENOUS
  Administered 2020-04-17: 20 mg via INTRAVENOUS

## 2020-04-17 NOTE — Op Note (Signed)
[facility] Gastroenterology Patient Name: Sameera Betton Procedure Date: 04/17/2020 8:20 AM MRN: [id number] Account #: [id number] Date of Birth: [birth year] Admit Type: Outpatient Age: 59 Room: [facility] ENDO ROOM 4 Gender: Female Note Status: Finalized Procedure:             Colonoscopy Indications:           High risk colon cancer surveillance: Personal history                         of colonic polyps Providers:             [name] MD, MD Referring MD:          Kirstie Peri. Caryn Section, MD (Referring MD) Medicines:             Propofol per Anesthesia Complications:         No immediate complications. Procedure:             Pre-Anesthesia Assessment:                        - Prior to the procedure, a History and Physical was                         performed, and patient medications and allergies were                         reviewed. The patient's tolerance of previous                         anesthesia was also reviewed. The risks and benefits                         of the procedure and the sedation options and risks                         were discussed with the patient. All questions were                         answered, and informed consent was obtained. Prior                         Anticoagulants: The patient has taken no previous                         anticoagulant or antiplatelet agents. ASA Grade                         Assessment: II - A patient with mild systemic disease.                         After reviewing the risks and benefits, the patient                         was deemed in satisfactory condition to undergo the                         procedure.  After obtaining informed consent, the colonoscope was                         passed under direct vision. Throughout the procedure,                         the patient's blood pressure, pulse, and oxygen                         saturations were monitored continuously. The                          Colonoscope was introduced through the anus and                         advanced to the the cecum, identified by appendiceal                         orifice and ileocecal valve. The colonoscopy was                         performed without difficulty. The patient tolerated                         the procedure well. The quality of the bowel                         preparation was excellent. Findings:      The perianal and digital rectal examinations were normal.      A 3 mm polyp was found in the ascending colon. The polyp was sessile.       The polyp was removed with a cold biopsy forceps. Resection and       retrieval were complete. Impression:            - One 3 mm polyp in the ascending colon, removed with                         a cold biopsy forceps. Resected and retrieved. Recommendation:        - Discharge patient to home.                        - Resume previous diet.                        - Continue present medications.                        - Await pathology results.                        - Repeat colonoscopy in 7 years for surveillance. Procedure Code(s):     --- Professional ---                        [phone], Colonoscopy, flexible; with biopsy, single or                         multiple Diagnosis Code(s):     --- Professional ---  Z86.010, Personal history of colonic polyps                        K63.5, Polyp of colon CPT copyright 2019 American Medical Association. All rights reserved. The codes documented in this report are preliminary and upon coder review may  be revised to meet current compliance requirements. [name] MD, MD 04/17/2020 8:42:33 AM This report has been signed electronically. Number of Addenda: 0 Note Initiated On: 04/17/2020 8:20 AM Scope Withdrawal Time: 0 hours 7 minutes 0 seconds  Total Procedure Duration: 0 hours 10 minutes 42 seconds  Estimated Blood Loss:  Estimated blood loss: none.      [facility]

## 2020-04-17 NOTE — Anesthesia Preprocedure Evaluation (Signed)
Anesthesia Evaluation  Patient identified by MRN, date of birth, ID band Patient awake    Reviewed: Allergy & Precautions, NPO status , Patient's Chart, lab work & pertinent test results  History of Anesthesia Complications Negative for: history of anesthetic complications  Airway Mallampati: II  TM Distance: >3 FB Neck ROM: Full    Dental  (+) Missing, Teeth Intact   Pulmonary neg pulmonary ROS, neg sleep apnea, neg COPD, Patient abstained from smoking.Not current smoker, former smoker,    Pulmonary exam normal breath sounds clear to auscultation       Cardiovascular Exercise Tolerance: Good METShypertension, (-) CAD and (-) Past MI (-) dysrhythmias  Rhythm:Regular Rate:Normal - Systolic murmurs    Neuro/Psych negative neurological ROS  negative psych ROS   GI/Hepatic neg GERD  ,(+)     (-) substance abuse  ,   Endo/Other  neg diabetes  Renal/GU negative Renal ROS     Musculoskeletal   Abdominal   Peds  Hematology   Anesthesia Other Findings Past Medical History: No date: History of chicken pox No date: Low back pain 08/19/2017: Muscle spasms of neck No date: Palpitations No date: Trochanteric bursitis of left hip 05/21/2015: Trochanteric bursitis of left hip 08/2014: Tubular adenoma of colon  Reproductive/Obstetrics                             Anesthesia Physical Anesthesia Plan  ASA: II  Anesthesia Plan: General   Post-op Pain Management:    Induction: Intravenous  PONV Risk Score and Plan: 3 and Ondansetron, Propofol infusion and TIVA  Airway Management Planned: Nasal Cannula  Additional Equipment: None  Intra-op Plan:   Post-operative Plan:   Informed Consent: I have reviewed the patients History and Physical, chart, labs and discussed the procedure including the risks, benefits and alternatives for the proposed anesthesia with the patient or authorized  representative who has indicated his/her understanding and acceptance.     Dental advisory given  Plan Discussed with: CRNA and Surgeon  Anesthesia Plan Comments: (Discussed risks of anesthesia with patient, including possibility of difficulty with spontaneous ventilation under anesthesia necessitating airway intervention, PONV, and rare risks such as cardiac or respiratory or neurological events. Patient understands.)        Anesthesia Quick Evaluation

## 2020-04-17 NOTE — Transfer of Care (Signed)
Immediate Anesthesia Transfer of Care Note  Patient: Tina Fuller  Procedure(s) Performed: COLONOSCOPY WITH PROPOFOL (N/A )  Patient Location: PACU  Anesthesia Type:General  Level of Consciousness: drowsy  Airway & Oxygen Therapy: Patient Spontanous Breathing  Post-op Assessment: Report given to RN and Post -op Vital signs reviewed and stable  Post vital signs: Reviewed and stable  Last Vitals:  Vitals Value Taken Time  BP 140/80 04/17/20 0844  Temp    Pulse 80 04/17/20 0845  Resp 17 04/17/20 0845  SpO2 100 % 04/17/20 0845  Vitals shown include unvalidated device data.  Last Pain:  Vitals:   04/17/20 0756  TempSrc: Temporal  PainSc: 5          Complications: No complications documented.

## 2020-04-17 NOTE — H&P (Signed)
[name], MD [address] [place], [place] [address] Phone:[phone] Fax : [phone]  Primary Care Physician:  Birdie Sons, MD Primary Gastroenterologist:  Dr. Allen Norris  Pre-Procedure History & Physical: HPI:  Tina Fuller is a 59 y.o. female is here for an colonoscopy.   Past Medical History:  Diagnosis Date  . History of chicken pox   . Low back pain   . Muscle spasms of neck 08/19/2017  . Palpitations   . Trochanteric bursitis of left hip   . Trochanteric bursitis of left hip 05/21/2015  . Tubular adenoma of colon 08/2014    Past Surgical History:  Procedure Laterality Date  . BREAST BIOPSY     Benign  . COLONOSCOPY    . fissurotomy    . LIPOMA EXCISION Left    left side    Prior to Admission medications   Medication Sig Start Date End Date Taking? Authorizing Provider  aspirin EC 81 MG tablet Take 81 mg by mouth daily. Swallow whole.   Yes [provider]  Multiple Vitamin (MULTIVITAMIN) tablet Take 1 tablet by mouth daily.   Yes [provider]  Naproxen Sodium (ALEVE PO) Take by mouth. PRN   Yes [provider]  NON FORMULARY Vita Fusion Takes 1 tablet qd.   Yes [provider]  phentermine 15 MG capsule Take 1 capsule (15 mg total) by mouth every morning. 04/04/20  Yes Birdie Sons, MD  topiramate (TOPAMAX) 50 MG tablet Take 1 tablet (50 mg total) by mouth daily. 04/04/20  Yes Birdie Sons, MD  losartan (COZAAR) 25 MG tablet Take 1 tablet (25 mg total) by mouth daily. 12/28/19 03/27/20  Loel Dubonnet, NP    Allergies as of 03/22/2020  . (No Known Allergies)    Family History  Problem Relation Age of Onset  . Heart failure Father   . Hyperlipidemia Father   . Hypertension Father   . Cerebrovascular Accident Father   . Hypertension Mother   . Hyperlipidemia Mother   . Diabetes Mother   . Alzheimer's disease Mother   . Cerebrovascular Accident Mother   . Heart disease Brother     . Hypertension Brother   . Hyperlipidemia Brother   . Diabetes Brother   . Heart disease Brother   . Hyperlipidemia Brother   . Hypertension Brother   . Heart disease Sister   . Diabetes Sister   . Heart failure Sister   . Heart failure Sister     Social History   Socioeconomic History  . Marital status: Married    Spouse name: Not on file  . Number of children: 2  . Years of education: GED  . Highest education level: Not on file  Occupational History  . 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 Smoking status: Former Smoker    Packs/day: 0.25    Years: 8.00    Pack years: 2.00  . Smokeless tobacco: Never Used  . Tobacco comment: smoked less than 1/2 ppd for 5-10 years   Vaping Use  . Vaping Use: Never used  Substance and Sexual Activity  . Alcohol use: Yes    Comment: occasional   . Drug use: No  . Sexual activity: Not on file  Other Topics Concern  . Not on file  Social History Narrative   Divorced; full time; exercises regularly - walking    Social  Determinants of Health   Financial Resource Strain:   . Difficulty of Paying Living Expenses: Not on file  Food Insecurity:   . Worried About Charity fundraiser in the Last Year: Not on file  . Ran Out of Food in the Last Year: Not on file  Transportation Needs:   . Lack of Transportation (Medical): Not on file  . Lack of Transportation (Non-Medical): Not on file  Physical Activity:   . Days of Exercise per Week: Not on file  . Minutes of Exercise per Session: Not on file  Stress:   . Feeling of Stress : Not on file  Social Connections:   . Frequency of Communication with Friends and Family: Not on file  . Frequency of Social Gatherings with Friends and Family: Not on file  . Attends Religious Services: Not on file  . Active Member of Clubs or Organizations: Not on file  . Attends Archivist Meetings: Not on file  . Marital Status: Not  on file  Intimate Partner Violence:   . Fear of Current or Ex-Partner: Not on file  . Emotionally Abused: Not on file  . Physically Abused: Not on file  . Sexually Abused: Not on file    Review of Systems: See HPI, otherwise negative ROS  Physical Exam: BP (!) 176/93   Pulse 73   Temp (!) 96.5 F (35.8 C) (Temporal)   Resp 18   Ht 5\' 8"  (1.727 m)   Wt 89.8 kg   BMI 30.11 kg/m  General:   Alert,  pleasant and cooperative in NAD Head:  Normocephalic and atraumatic. Neck:  Supple; no masses or thyromegaly. Lungs:  Clear throughout to auscultation.    Heart:  Regular rate and rhythm. Abdomen:  Soft, nontender and nondistended. Normal bowel sounds, without guarding, and without rebound.   Neurologic:  Alert and  oriented x4;  grossly normal neurologically.  Impression/Plan: Tina Fuller is here for an colonoscopy to be performed for a history of adenomatous polyps on 08/2014   Risks, benefits, limitations, and alternatives regarding  colonoscopy have been reviewed with the patient.  Questions have been answered.  All parties agreeable.   [name], MD  04/17/2020, 8:21 AM

## 2020-04-17 NOTE — Anesthesia Postprocedure Evaluation (Signed)
Anesthesia Post Note  Patient: ELENER CUSTODIO  Procedure(s) Performed: COLONOSCOPY WITH PROPOFOL (N/A )  Patient location during evaluation: Endoscopy Anesthesia Type: General Level of consciousness: awake and alert Pain management: pain level controlled Vital Signs Assessment: post-procedure vital signs reviewed and stable Respiratory status: spontaneous breathing, nonlabored ventilation, respiratory function stable and patient connected to nasal cannula oxygen Cardiovascular status: blood pressure returned to baseline and stable Postop Assessment: no apparent nausea or vomiting Anesthetic complications: no   No complications documented.   Last Vitals:  Vitals:   04/17/20 0756 04/17/20 0844  BP: (!) 176/93 140/80  Pulse: 73   Resp: 18   Temp: (!) 35.8 C (!) 36.3 C    Last Pain:  Vitals:   04/17/20 0844  TempSrc: Temporal  PainSc: Asleep                 Arita Miss

## 2020-04-18 ENCOUNTER — Encounter: Payer: Self-pay | Admitting: Gastroenterology

## 2020-04-18 LAB — SURGICAL PATHOLOGY

## 2020-06-27 ENCOUNTER — Ambulatory Visit: Payer: No Typology Code available for payment source | Admitting: Family Medicine

## 2020-06-27 NOTE — Progress Notes (Deleted)
      Established patient visit   Patient: Tina Fuller   DOB: 1960/12/15   60 y.o. Female  MRN: [id number] Visit Date: 06/27/2020  Today's healthcare provider: Lelon Huh, MD   No chief complaint on file.  Subjective    HPI  Follow up for obesity:  The patient was last seen for this 3 months ago. Changes made at last visit include none; continue current medications topiramate and phentermine. Also continue regular exercise average of 30 minutes per day.   She reports {excellent/good/fair/poor:19665} compliance with treatment. She feels that condition is {improved/worse/unchanged:3041574}. She {is/is not:21021397} having side effects. ***  -----------------------------------------------------------------------------------------  Hypertension, follow-up  BP Readings from Last 3 Encounters:  04/17/20 138/80  04/04/20 139/86  03/02/20 134/84   Wt Readings from Last 3 Encounters:  04/17/20 198 lb (89.8 kg)  04/04/20 200 lb (90.7 kg)  03/02/20 210 lb (95.3 kg)     She was last seen for hypertension 4 months ago.  BP at that visit was 134/84. Management since that visit includes advising patient to continue current dose of losartan for now and work on losing weight and sodium avoidance.  She reports {excellent/good/fair/poor:19665} compliance with treatment. She {is/is not:9024} having side effects. {document side effects if present:1} She is following a {diet:21022986} diet. She {is/is not:9024} exercising. She {does/does not:200015} smoke.  Use of agents associated with hypertension: NSAIDS.   Outside blood pressures are {***enter patient reported home BP readings, or 'not being checked':1}. Symptoms: {Yes/No:20286} chest pain {Yes/No:20286} chest pressure  {Yes/No:20286} palpitations {Yes/No:20286} syncope  {Yes/No:20286} dyspnea {Yes/No:20286} orthopnea  {Yes/No:20286} paroxysmal nocturnal dyspnea {Yes/No:20286} lower extremity edema   Pertinent  labs: Lab Results  Component Value Date   CHOL 200 (H) 12/28/2019   HDL 72 12/28/2019   LDLCALC 113 (H) 12/28/2019   TRIG 83 12/28/2019   CHOLHDL 2.8 12/28/2019   Lab Results  Component Value Date   NA 137 12/28/2019   K 4.7 12/28/2019   CREATININE 0.71 12/28/2019   GFRNONAA 96 12/28/2019   GFRAA 110 12/28/2019   GLUCOSE 89 12/28/2019     The 10-year ASCVD risk score Mikey Bussing DC Jr., et al., 2013) is: 4%   ---------------------------------------------------------------------------------------------------   {Show patient history (optional):23778::" "}   Medications: Outpatient Medications Prior to Visit  Medication Sig  . aspirin EC 81 MG tablet Take 81 mg by mouth daily. Swallow whole.  . losartan (COZAAR) 25 MG tablet Take 1 tablet (25 mg total) by mouth daily.  . Multiple Vitamin (MULTIVITAMIN) tablet Take 1 tablet by mouth daily.  . Naproxen Sodium (ALEVE PO) Take by mouth. PRN  . NON FORMULARY Vita Fusion Takes 1 tablet qd.  . phentermine 15 MG capsule Take 1 capsule (15 mg total) by mouth every morning.  . topiramate (TOPAMAX) 50 MG tablet Take 1 tablet (50 mg total) by mouth daily.   No facility-administered medications prior to visit.    Review of Systems  {Labs  Heme  Chem  Endocrine  Serology  Results Review (optional):23779::" "}  Objective    There were no vitals taken for this visit. {Show previous vital signs (optional):23777::" "}  Physical Exam  ***  No results found for any visits on 06/27/20.  Assessment & Plan     ***  No follow-ups on file.      {provider attestation***:1}   Lelon Huh, MD  [facility] [phone] (phone) [phone] (fax)  [place]

## 2020-07-16 ENCOUNTER — Telehealth: Payer: Self-pay

## 2020-07-16 NOTE — Telephone Encounter (Signed)
Appt scheduled to discuss

## 2020-07-16 NOTE — Telephone Encounter (Signed)
Copied from [place] [id number]. Topic: General - Other >> Jul 16, 2020 12:58 PM Keene Breath wrote: Reason for CRM: Patient took a home covid test which was positive.  Patient would like to know the next steps as far as her work.  Please call to discuss at [phone]

## 2020-07-17 ENCOUNTER — Telehealth (INDEPENDENT_AMBULATORY_CARE_PROVIDER_SITE_OTHER): Payer: No Typology Code available for payment source | Admitting: Physician Assistant

## 2020-07-17 DIAGNOSIS — U071 COVID-19: Secondary | ICD-10-CM | POA: Diagnosis not present

## 2020-07-17 DIAGNOSIS — J029 Acute pharyngitis, unspecified: Secondary | ICD-10-CM

## 2020-07-17 MED ORDER — PROMETHAZINE-DM 6.25-15 MG/5ML PO SYRP: 5.0000 mL | ORAL_SOLUTION | Freq: Every evening | ORAL | 0 refills | Status: DC | PRN

## 2020-07-17 NOTE — Patient Instructions (Signed)
COVID-19 Quarantine vs. Isolation QUARANTINE keeps someone who was in close contact with someone who has COVID-19 away from others. Quarantine if you have been in close contact with someone who has COVID-19, unless you have been fully vaccinated. If you are fully vaccinated  You do NOT need to quarantine unless they have symptoms  Get tested 3-5 days after your exposure, even if you don't have symptoms  Wear a mask indoors in public for 14 days following exposure or until your test result is negative If you are not fully vaccinated  Stay home for 14 days after your last contact with a person who has COVID-19  Watch for fever (100.4F), cough, shortness of breath, or other symptoms of COVID-19  If possible, stay away from people you live with, especially people who are at higher risk for getting very sick from COVID-19  Contact your local public health department for options in your area to possibly shorten your quarantine ISOLATION keeps someone who is sick or tested positive for COVID-19 without symptoms away from others, even in their own home. People who are in isolation should stay home and stay in a specific "sick room" or area and use a separate bathroom (if available). If you are sick and think or know you have COVID-19 Stay home until after  At least 10 days since symptoms first appeared and  At least 24 hours with no fever without the use of fever-reducing medications and  Symptoms have improved If you tested positive for COVID-19 but do not have symptoms  Stay home until after 10 days have passed since your positive viral test  If you develop symptoms after testing positive, follow the steps above for those who are sick cdc.gov/coronavirus 03/12/2020 This information is not intended to replace advice given to you by your health care provider. Make sure you discuss any questions you have with your health care provider. Document Revised: 04/16/2020 Document Reviewed:  04/16/2020 Elsevier Patient Education  2021 Elsevier Inc.  

## 2020-07-17 NOTE — Progress Notes (Signed)
MyChart Video Visit    Virtual Visit via Video Note   This visit type was conducted due to national recommendations for restrictions regarding the COVID-19 Pandemic (e.g. social distancing) in an effort to limit this patient's exposure and mitigate transmission in our community. This patient is at least at moderate risk for complications without adequate follow up. This format is felt to be most appropriate for this patient at this time. Physical exam was limited by quality of the video and audio technology used for the visit.   Patient location: Home Provider location: Office   I discussed the limitations of evaluation and management by telemedicine and the availability of in person appointments. The patient expressed understanding and agreed to proceed.  Patient: Tina Fuller   DOB: 1961-02-07   60 y.o. Female  MRN: [id number] Visit Date: 07/17/2020  Today's healthcare provider: Trinna Post, PA-C   Chief Complaint  Patient presents with  . URI  . Covid Positive  I,Sybol Morre M Kenadie Royce,acting as a scribe for Trinna Post, PA-C.,have documented all relevant documentation on the behalf of Trinna Post, PA-C,as directed by  Trinna Post, PA-C while in the presence of Trinna Post, PA-C.  Subjective    URI  This is a new problem. The current episode started in the past 7 days. The problem has been gradually improving. There has been no fever. Associated symptoms include congestion, coughing, diarrhea, headaches, rhinorrhea, sinus pain, sneezing and a sore throat. Pertinent negatives include no ear pain, nausea, vomiting or wheezing. She has tried decongestant, increased fluids, antihistamine and NSAIDs for the symptoms. The treatment provided mild relief.  Took a home covid test on yesterday,07/16/2020 and it came back positive.  She has been having symptoms for four days. No SOB or trouble breathing. No wheezing. She is coughing.   Medications: Outpatient  Medications Prior to Visit  Medication Sig  . aspirin EC 81 MG tablet Take 81 mg by mouth daily. Swallow whole.  . Multiple Vitamin (MULTIVITAMIN) tablet Take 1 tablet by mouth daily.  . Naproxen Sodium (ALEVE PO) Take by mouth. PRN  . NON FORMULARY Vita Fusion Takes 1 tablet qd.  . phentermine 15 MG capsule Take 1 capsule (15 mg total) by mouth every morning.  . topiramate (TOPAMAX) 50 MG tablet Take 1 tablet (50 mg total) by mouth daily.  Marland Kitchen losartan (COZAAR) 25 MG tablet Take 1 tablet (25 mg total) by mouth daily.   No facility-administered medications prior to visit.    Review of Systems  Constitutional: Positive for chills and fatigue. Negative for appetite change and fever.  HENT: Positive for congestion, rhinorrhea, sinus pressure, sinus pain, sneezing and sore throat. Negative for ear pain and postnasal drip.   Respiratory: Positive for cough and chest tightness. Negative for shortness of breath and wheezing.   Gastrointestinal: Positive for diarrhea. Negative for nausea and vomiting.  Neurological: Positive for weakness and headaches.      Objective    There were no vitals taken for this visit.   Physical Exam Constitutional:      Appearance: Normal appearance.  Pulmonary:     Effort: Pulmonary effort is normal. No respiratory distress.  Neurological:     Mental Status: She is alert.  Psychiatric:        Mood and Affect: Mood normal.        Behavior: Behavior normal.        Assessment & Plan     1. COVID-19  -  symptoms c/w viral URI  - no evidence of strep pharyngitis, CAP, AOM, bacterial sinusitis, or other bacterial infection - concern for possible COVID19 infection - will send for outpatient testing - discussed need to quarantine 10 days from start of symptoms (or possibly 5 days with new CDC recommendations) and until fever-free for at least 24 hours - discussed symptomatic management, natural course, and return precautions   -  promethazine-dextromethorphan (PROMETHAZINE-DM) 6.25-15 MG/5ML syrup; Take 5 mLs by mouth at bedtime as needed for cough.  Dispense: 118 mL; Refill: 0  2. Sore throat    Return if symptoms worsen or fail to improve.     I discussed the assessment and treatment plan with the patient. The patient was provided an opportunity to ask questions and all were answered. The patient agreed with the plan and demonstrated an understanding of the instructions.   The patient was advised to call back or seek an in-person evaluation if the symptoms worsen or if the condition fails to improve as anticipated.   ITrinna Post, PA-C, have reviewed all documentation for this visit. The documentation on 07/17/20 for the exam, diagnosis, procedures, and orders are all accurate and complete.  The entirety of the information documented in the History of Present Illness, Review of Systems and Physical Exam were personally obtained by me. Portions of this information were initially documented by [facility] and reviewed by me for thoroughness and accuracy.    Paulene Floor [facility] [phone] (phone) [phone] (fax)  [place]

## 2020-07-30 ENCOUNTER — Ambulatory Visit: Payer: No Typology Code available for payment source | Admitting: Cardiovascular Disease

## 2020-08-01 ENCOUNTER — Telehealth: Payer: Self-pay

## 2020-08-01 DIAGNOSIS — U071 COVID-19: Secondary | ICD-10-CM

## 2020-08-01 NOTE — Telephone Encounter (Signed)
Copied from [place] [id number]. Topic: General - Other >> Aug 01, 2020  3:02 PM Tina Fuller wrote: Reason for CRM: Patient called in to say that she have recovered from Covid but need an aRx sent to the pharmacy for promethazine-dextromethorphan (PROMETHAZINE-DM) 6.25-15 MG/5ML syrup since she still have the cough. Please call with questions Ph# [phone]

## 2020-08-02 MED ORDER — PROMETHAZINE-DM 6.25-15 MG/5ML PO SYRP: 5.0000 mL | ORAL_SOLUTION | Freq: Every evening | ORAL | 0 refills | Status: DC | PRN

## 2020-08-02 NOTE — Addendum Note (Signed)
Addended by: Trinna Post on: 08/02/2020 08:30 AM   Modules accepted: Orders

## 2020-08-02 NOTE — Telephone Encounter (Signed)
Rx sent 

## 2020-08-02 NOTE — Telephone Encounter (Signed)
Called patient and advised her via vm that medication was send into pharmacy.

## 2020-08-15 ENCOUNTER — Ambulatory Visit: Payer: No Typology Code available for payment source | Admitting: Family

## 2020-08-29 ENCOUNTER — Other Ambulatory Visit: Payer: Self-pay

## 2020-08-29 ENCOUNTER — Ambulatory Visit (INDEPENDENT_AMBULATORY_CARE_PROVIDER_SITE_OTHER): Payer: No Typology Code available for payment source | Admitting: Family

## 2020-08-29 ENCOUNTER — Encounter: Payer: Self-pay | Admitting: Family

## 2020-08-29 VITALS — BP 132/80 | HR 75 | Ht 69.0 in | Wt 198.0 lb

## 2020-08-29 DIAGNOSIS — I10 Essential (primary) hypertension: Secondary | ICD-10-CM | POA: Diagnosis not present

## 2020-08-29 DIAGNOSIS — E782 Mixed hyperlipidemia: Secondary | ICD-10-CM | POA: Diagnosis not present

## 2020-08-29 DIAGNOSIS — M79605 Pain in left leg: Secondary | ICD-10-CM | POA: Diagnosis not present

## 2020-08-29 MED ORDER — LOSARTAN POTASSIUM 25 MG PO TABS: 25.0000 mg | ORAL_TABLET | Freq: Every day | ORAL | 3 refills | Status: DC

## 2020-08-29 NOTE — Patient Instructions (Addendum)
Medication Instructions:  Continue your current medications.   *If you need a refill on your cardiac medications before your next appointment, please call your pharmacy*   Lab Work: Your provider recommends that you return for lab work in 1-2 weeks at the [facility] for fasting lipid panel, CMP.  Testing/Procedures: Your EKG today showed normal sinus rhythm.   Follow-Up: At [facility], you and your health needs are our priority.  As part of our continuing mission to provide you with exceptional heart care, we have created designated Provider Care Teams.  These Care Teams include your primary Cardiologist (physician) and Advanced Practice Providers (APPs -  Physician Assistants and Nurse Practitioners) who all work together to provide you with the care you need, when you need it.  We recommend signing up for the patient portal called "MyChart".  Sign up information is provided on this After Visit Summary.  MyChart is used to connect with patients for Virtual Visits (Telemedicine).  Patients are able to view lab/test results, encounter notes, upcoming appointments, etc.  Non-urgent messages can be sent to your provider as well.   To learn more about what you can do with MyChart, go to [email or web address].    Your next appointment:   6-8 month(s)  The format for your next appointment:   In Person  Provider:   You may see Ida Rogue, MD or one of the following Advanced Practice Providers on your designated Care Team:    Murray Hodgkins, NP  Christell Faith, PA-C  Marrianne Mood, PA-C  Cadence Kathlen Mody, [place]  Laurann Montana, NP  Other Instructions   Recommend trying Voltaren gel for your leg pain and following up with Dr. Caryn Section.  Heart Healthy Diet Recommendations: A low-salt diet is recommended. Meats should be grilled, baked, or boiled. Avoid fried foods. Focus on lean protein sources like fish or chicken with vegetables and fruits. The American Heart Association is  a Microbiologist!  American Heart Association Diet and Lifeystyle Recommendations   Exercise recommendations: The American Heart Association recommends 150 minutes of moderate intensity exercise weekly. Try 30 minutes of moderate intensity exercise 4-5 times per week. This could include walking, jogging, or swimming.

## 2020-08-29 NOTE — Progress Notes (Signed)
Office Visit    Patient Name: Tina Fuller Date of Encounter: 08/29/2020  Primary Care Provider:  Birdie Sons, MD Primary Cardiologist:  Ida Rogue, MD Electrophysiologist:  None   Chief Complaint    Tina Fuller is a 60 y.o. female with a hx of HTN, previous tobacco use, HLD, leg pain presents today for follow-up of hypertension.  Past Medical History    Past Medical History:  Diagnosis Date  . History of chicken pox   . Low back pain   . Muscle spasms of neck 08/19/2017  . Palpitations   . Trochanteric bursitis of left hip   . Trochanteric bursitis of left hip 05/21/2015  . Tubular adenoma of colon 08/2014   Past Surgical History:  Procedure Laterality Date  . BREAST BIOPSY     Benign  . COLONOSCOPY    . COLONOSCOPY WITH PROPOFOL N/A 04/17/2020   Procedure: COLONOSCOPY WITH PROPOFOL;  Surgeon: [name], MD;  Location: [facility];  Service: Endoscopy;  Laterality: N/A;  . fissurotomy    . LIPOMA EXCISION Left    left side    Allergies  No Known Allergies  History of Present Illness    Tina Fuller is a 60 y.o. female with a hx ofHTN, previous tobacco use, HLD, leg pain. She was last seen 01/25/20. Family history notable for heart disease and diabetes.   Previously evaluated by Dr. Rockey Situ for tachycardia, chest tightness, palpitations. Previous chest tightness atypical for angina and presumed musculoskeletal. Palpitations self resolved. She has previously reported LE pain but as her pedal pulses were good, imaging was deferred.    She was seen July 2021 and started on Losartan for elevated blood pressure. She had lower extremity ABI due to leg pain which were negative for PAD. At follow up August 2021 her blood pressure was at goal.  Presents today for follow up. Reports home BP routinely 130/80s. Reports no shortness of breath nor dyspnea on exertion. Reports no chest pain, pressure, or tightness. No edema, orthopnea, PND. She has  been working diligently on weight loss with assistance of her PCP taking phenteramine and topiramate. No palpitations.    EKGs/Labs/Other Studies Reviewed:   The following studies were reviewed today:  LE Duplex 01/23/20 Summary:  Right: Resting right ankle-brachial index is within normal range. No  evidence of significant right lower extremity arterial disease. The right  toe-brachial index is normal.   Left: Resting left ankle-brachial index is within normal range. No  evidence of significant left lower extremity arterial disease. The left  toe-brachial index is normal.    EKG: EKG ordered today. The EKG today demonstrates NSR 73 bpm with no acute ST/T wave changes.   Recent Labs: 12/28/2019: ALT 11; BUN 11; Creatinine, Ser 0.71; Hemoglobin 12.4; Platelets 368; Potassium 4.7; Sodium 137  Recent Lipid Panel    Component Value Date/Time   CHOL 200 (H) 12/28/2019 1151   TRIG 83 12/28/2019 1151   HDL 72 12/28/2019 1151   CHOLHDL 2.8 12/28/2019 1151   LDLCALC 113 (H) 12/28/2019 1151    Home Medications   Current Meds  Medication Sig  . aspirin EC 81 MG tablet Take 81 mg by mouth daily. Swallow whole.  . losartan (COZAAR) 25 MG tablet Take 1 tablet (25 mg total) by mouth daily.  . Multiple Vitamin (MULTIVITAMIN) tablet Take 1 tablet by mouth daily.  . Naproxen Sodium (ALEVE PO) Take by mouth. PRN  . NON FORMULARY Vita Fusion Takes 1  tablet qd.  . phentermine 15 MG capsule Take 1 capsule (15 mg total) by mouth every morning.  . promethazine-dextromethorphan (PROMETHAZINE-DM) 6.25-15 MG/5ML syrup Take 5 mLs by mouth at bedtime as needed for cough.  . topiramate (TOPAMAX) 50 MG tablet Take 1 tablet (50 mg total) by mouth daily.    Review of Systems    All other systems reviewed and are otherwise negative except as noted above.  Physical Exam    VS:  BP 140/80 (BP Location: Left Arm, Patient Position: Sitting, Cuff Size: Normal)   Pulse 75   Ht 5\' 9"  (1.753 m)   Wt 198 lb  (89.8 kg)   SpO2 99%   BMI 29.24 kg/m  , BMI Body mass index is 29.24 kg/m. GEN: Well nourished, overweight, well developed, in no acute distress. HEENT: normal. Neck: Supple, no JVD, carotid bruits, or masses. Cardiac: RRR, no murmurs, rubs, or gallops. No clubbing, cyanosis, edema.  Radials/DP/PT 2+ and equal bilaterally.  Respiratory:  Respirations regular and unlabored, clear to auscultation bilaterally. GI: Soft, nontender, nondistended. MS: No deformity or atrophy. Skin: Warm and dry, no rash. Neuro:  Strength and sensation are intact. Psych: Normal affect.  Assessment & Plan   1. HTN - BP well controlled. Continue current antihypertensive regimen of Losartan 25mg  QD.   2. LLE pain / Venous insufficiency - ABI 01/2020 with no evidence of PAD.  Likely cramps versus venous insufficiency vs RLS.  Known hx of arthritis. Continued LLE pain in calf at night. Some relief with NSAID. Recommend OTC Voltaren gel and follow-up with PCP.  3. HLD - Not presently on statin. 12/28/19 with total cholesterol 200, HDL 72, triglycerides 83, LDL 113. Lipid lowering diet recommended. Repeat lipid panel, CMP ordered for [facility] at her convenience.   Disposition: Follow up  in 6-8 month(s) with Dr. Rockey Situ or APP.   Loel Dubonnet, NP 08/29/2020, 2:22 PM

## 2020-10-17 ENCOUNTER — Other Ambulatory Visit: Payer: Self-pay | Admitting: Family Medicine

## 2020-10-17 DIAGNOSIS — E669 Obesity, unspecified: Secondary | ICD-10-CM

## 2020-10-17 NOTE — Telephone Encounter (Signed)
Requested medication (s) are due for refill today: yes  Requested medication (s) are on the active medication list: yes  Last refill:  04/04/20 #30 3 refills   Future visit scheduled: no  Notes to clinic:  not delegated per protocol     Requested Prescriptions  Pending Prescriptions Disp Refills   phentermine 15 MG capsule [Pharmacy Med Name: Phentermine HCl 15 MG Oral Capsule] 30 capsule 0    Sig: Take 1 capsule by mouth in the morning      Not Delegated - Gastroenterology:  Antiobesity Agents Failed - 10/17/2020  2:33 PM      Failed - This refill cannot be delegated      Passed - Last BP in normal range    BP Readings from Last 1 Encounters:  08/29/20 132/80          Passed - Last Heart Rate in normal range    Pulse Readings from Last 1 Encounters:  08/29/20 75          Passed - Valid encounter within last 12 months    Recent Outpatient Visits           3 months ago [place], PA-C   6 months ago Obesity (BMI 30-39.9)   [employer], MD   7 months ago Annual physical exam   [facility] Birdie Sons, MD   2 years ago Annual physical exam   [facility] Birdie Sons, MD   2 years ago Rash   [facility] Birdie Sons, MD

## 2021-01-02 ENCOUNTER — Other Ambulatory Visit: Payer: Self-pay

## 2021-01-02 ENCOUNTER — Ambulatory Visit (INDEPENDENT_AMBULATORY_CARE_PROVIDER_SITE_OTHER): Payer: No Typology Code available for payment source | Admitting: Family Medicine

## 2021-01-02 ENCOUNTER — Ambulatory Visit
Admission: RE | Admit: 2021-01-02 | Discharge: 2021-01-02 | Disposition: A | Discharge status: Home / Self Care | Payer: No Typology Code available for payment source | Source: Ambulatory Visit | Attending: Family Medicine | Admitting: Family Medicine

## 2021-01-02 ENCOUNTER — Ambulatory Visit
Admission: RE | Admit: 2021-01-02 | Discharge: 2021-01-02 | Disposition: A | Discharge status: Home / Self Care | Payer: No Typology Code available for payment source | Attending: Family Medicine | Admitting: Family Medicine

## 2021-01-02 ENCOUNTER — Encounter: Payer: Self-pay | Admitting: Family Medicine

## 2021-01-02 VITALS — BP 134/81 | HR 86 | Temp 97.2°F | Resp 16 | Wt 197.0 lb

## 2021-01-02 DIAGNOSIS — M542 Cervicalgia: Secondary | ICD-10-CM

## 2021-01-02 DIAGNOSIS — Z6829 Body mass index (BMI) 29.0-29.9, adult: Secondary | ICD-10-CM | POA: Diagnosis not present

## 2021-01-02 DIAGNOSIS — R519 Headache, unspecified: Secondary | ICD-10-CM

## 2021-01-02 MED ORDER — TOPIRAMATE 50 MG PO TABS: 50.0000 mg | ORAL_TABLET | Freq: Every day | ORAL | 3 refills | Status: DC

## 2021-01-02 MED ORDER — PHENTERMINE HCL 15 MG PO CAPS: 15.0000 mg | ORAL_CAPSULE | Freq: Every morning | ORAL | 3 refills | Status: DC

## 2021-01-02 MED ORDER — CYCLOBENZAPRINE HCL 5 MG PO TABS: 5.0000 mg | ORAL_TABLET | Freq: Every evening | ORAL | 1 refills | Status: DC

## 2021-01-02 NOTE — Progress Notes (Signed)
Established patient visit   Patient: Tina Fuller   DOB: 18-Jul-1960   60 y.o. Female  MRN: [id number] Visit Date: 01/02/2021  Today's healthcare provider: Lelon Huh, MD   Chief Complaint  Patient presents with   Shoulder Pain   Headache   Subjective    Headache  This is a new problem. Episode onset: 2 months ago. The problem has been gradually improving. The pain is located in the Left unilateral region and originates in the left lower neck and upper shoulder, radiating into upper arm. The quality of the pain is described as throbbing. Associated symptoms include a visual change (occasional blurred vision). Pertinent negatives include no abdominal pain, dizziness, nausea, vomiting or weakness. Treatments tried: Aleve. The treatment provided mild relief.   Patient reports that she has been drinking wine to help ease the pain. Pain sometimes keeps at night. Aleve helps but she doesn't like to take it on a regular basis.       Medications: Outpatient Medications Prior to Visit  Medication Sig   aspirin EC 81 MG tablet Take 81 mg by mouth daily. Swallow whole.   losartan (COZAAR) 25 MG tablet Take 1 tablet (25 mg total) by mouth daily.   Naproxen Sodium (ALEVE PO) Take by mouth. PRN   NON FORMULARY Vita Fusion Takes 1 tablet qd.   phentermine 15 MG capsule Take 1 capsule by mouth in the morning   topiramate (TOPAMAX) 50 MG tablet Take 1 tablet (50 mg total) by mouth daily.   [DISCONTINUED] Multiple Vitamin (MULTIVITAMIN) tablet Take 1 tablet by mouth daily. (Patient not taking: Reported on 01/02/2021)   [DISCONTINUED] promethazine-dextromethorphan (PROMETHAZINE-DM) 6.25-15 MG/5ML syrup Take 5 mLs by mouth at bedtime as needed for cough. (Patient not taking: Reported on 01/02/2021)   No facility-administered medications prior to visit.    Review of Systems  Constitutional:  Negative for appetite change, chills and fatigue.  Respiratory:  Negative for chest tightness  and shortness of breath.   Cardiovascular:  Negative for chest pain and palpitations.  Gastrointestinal:  Negative for abdominal pain, nausea and vomiting.  Musculoskeletal:  Positive for arthralgias (shoulder pain).  Neurological:  Positive for headaches. Negative for dizziness and weakness.      Objective    BP 134/81 (BP Location: Left Arm, Patient Position: Sitting, Cuff Size: Normal)   Pulse 86   Temp (!) 97.2 F (36.2 C) (Temporal)   Resp 16   Wt 197 lb (89.4 kg)   LMP  (Within Weeks)   BMI 29.09 kg/m     Physical Exam   Tender along superior aspect of left trapezius. No spine tenderness. FROM of shoulder. FROM of neck with pain at full rotation to right and full flexion. No gross deformities.    Assessment & Plan     1. Frequent headaches Originating form left neck and shoulder as below.   2. Neck pain on left side  - DG Cervical Spine Complete; Future - cyclobenzaprine (FLEXERIL) 5 MG tablet; Take 1-2 tablets (5-10 mg total) by mouth every evening. As needed for muscle aches  Dispense: 30 tablet; Refill: 1  Consider NSAIDs, consider PT  3. Obesity  She states combination of phentermine and topiramate prescribed last year worked well and she request refill of prescriptions today.  Wt Readings from Last 5 Encounters:  01/02/21 197 lb (89.4 kg)  08/29/20 198 lb (89.8 kg)  04/17/20 198 lb (89.8 kg)  04/04/20 200 lb (90.7 kg)  03/02/20 210  lb (95.3 kg)    - phentermine 15 MG capsule; Take 1 capsule (15 mg total) by mouth every morning.  Dispense: 30 capsule; Refill: 3 - topiramate (TOPAMAX) 50 MG tablet; Take 1 tablet (50 mg total) by mouth daily.  Dispense: 30 tablet; Refill: 3      The entirety of the information documented in the History of Present Illness, Review of Systems and Physical Exam were personally obtained by me. Portions of this information were initially documented by the CMA and reviewed by me for thoroughness and accuracy.     Lelon Huh,  MD  [facility] [phone] (phone) [phone] (fax)  [place]

## 2021-01-02 NOTE — Patient Instructions (Signed)
.   Please review the attached list of medications and notify my office if there are any errors.   . Please bring all of your medications to every appointment so we can make sure that our medication list is the same as yours.   

## 2021-01-03 ENCOUNTER — Telehealth: Payer: Self-pay

## 2021-01-03 ENCOUNTER — Encounter: Payer: Self-pay | Admitting: Family Medicine

## 2021-01-03 DIAGNOSIS — M5030 Other cervical disc degeneration, unspecified cervical region: Secondary | ICD-10-CM | POA: Insufficient documentation

## 2021-01-03 MED ORDER — PREDNISONE 10 MG PO TABS: ORAL_TABLET | ORAL | 0 refills | Status: DC

## 2021-01-03 NOTE — Telephone Encounter (Addendum)
-----   Message from Marcello Moores, RN sent at 01/03/2021  3:49 PM EDT ----- Pt. Given results and instructions. Verbalizes understanding. Please send Prednisone to her pharmacy.    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 This usually responds to strong anti-inflammatory medications like prednisone.recommend prednisone 10mg  6 tab x 2 day, 5 x 2d, 4 x 2 d, 3 x2d. 2 x 2d, then 1 x 2d, #42 no refills.  Continue the cyclobenzaprine at bedtime until better. Call if not much betterwhen finished with prednisone

## 2021-01-03 NOTE — Telephone Encounter (Signed)
Prescription sent into pharmacy

## 2021-01-23 ENCOUNTER — Other Ambulatory Visit: Payer: Self-pay | Admitting: Family

## 2021-02-22 ENCOUNTER — Telehealth: Payer: Self-pay | Admitting: Family Medicine

## 2021-02-22 MED ORDER — FLUCONAZOLE 150 MG PO TABS: 150.0000 mg | ORAL_TABLET | Freq: Every day | ORAL | 0 refills | Status: DC

## 2021-02-22 NOTE — Telephone Encounter (Signed)
Medication Refill - Medication: Pt has a yeast infection, she was recently taking prednisone. Now she takes medication for arthritis and she believes her yeast infection comes from that. Says she is itching.   Best contact: [phone] (Requesting a call back from Nurse)   Has the patient contacted their pharmacy? Yes.   (Agent: If no, request that the patient contact the pharmacy for the refill.) (Agent: If yes, when and what did the pharmacy advise?)  Preferred Pharmacy (with phone number or street name):  [facility] [address] [place]  [place] [address]  Phone: [phone] Fax: [phone]    Agent: Please be advised that RX refills may take up to 3 business days. We ask that you follow-up with your pharmacy.

## 2021-03-26 ENCOUNTER — Ambulatory Visit: Payer: Self-pay | Admitting: *Deleted

## 2021-03-26 NOTE — Telephone Encounter (Signed)
Pt reports toenail of great toe, left foot had "Cracked" 6 months ago. Part of the nail has come off, noted 2 days ago.  States "May have been a little darker than the other nails."No problems with other nails. Denies any redness, no swelling, no drainage, no fever.  Denies any pain. Home care advise given. Pt verbalizes understanding.      Reason for Disposition  [1] Toenail comes off or is almost off AND [2] follows old injury  Ingrown toenail    NAil "Cracked, came off partially"  Answer Assessment - Initial Assessment Questions 1. LOCATION: "Which toe?"      Great toe, left foot 2. APPEARANCE: "What does it look like?"      Nail came off partially. 3. ONSET: "When did it start?"       "Cracked and darker 6 months ago 4. PAIN: "Is there any pain?" If Yes, ask: "How bad is the pain?"   (Scale 1-10; or mild, moderate, severe)     no 5. REDNESS: "Is there any redness of the skin?" If Yes, ask: "How much of the toe is red?"     no 6. OTHER SYMPTOMS: "Do you have any other symptoms?" (e.g., fever, shaking, chills, red streak up foot)     no  Protocols used: Toe Injury-A-AH, Toenail - Ingrown-A-AH

## 2021-03-26 NOTE — Telephone Encounter (Signed)
Pt stated her toe nail came off and was inside her sock. Pt requests call back from a nurse to discuss medication that she could possibly try. Cb# [phone]   Attempted to contact patient- left message to call office

## 2021-04-15 ENCOUNTER — Ambulatory Visit (INDEPENDENT_AMBULATORY_CARE_PROVIDER_SITE_OTHER): Payer: No Typology Code available for payment source

## 2021-04-15 ENCOUNTER — Other Ambulatory Visit: Payer: Self-pay

## 2021-04-15 ENCOUNTER — Encounter: Payer: Self-pay | Admitting: Podiatry

## 2021-04-15 ENCOUNTER — Ambulatory Visit (INDEPENDENT_AMBULATORY_CARE_PROVIDER_SITE_OTHER): Payer: No Typology Code available for payment source | Admitting: Podiatry

## 2021-04-15 DIAGNOSIS — M21611 Bunion of right foot: Secondary | ICD-10-CM | POA: Diagnosis not present

## 2021-04-15 DIAGNOSIS — L600 Ingrowing nail: Secondary | ICD-10-CM | POA: Diagnosis not present

## 2021-04-15 NOTE — Patient Instructions (Signed)

## 2021-04-15 NOTE — Progress Notes (Signed)
Subjective:   Patient ID: Tina Fuller, female   DOB: 60 y.o.   MRN: [id number]   HPI Patient presents stating she has a painful bunion right that increasingly has been hard for her with patient trying to wear wider shoes soaks and cushioning around the area without relief and has an ingrown toenail with history of nail damage left big toe that sore.  Patient does not smoke likes to be active and this is been going on for a number of years getting worse over the last 6 months   Review of Systems  All other systems reviewed and are negative.      Objective:  Physical Exam Vitals and nursing note reviewed.  Constitutional:      Appearance: She is well-developed.  Pulmonary:     Effort: Pulmonary effort is normal.  Musculoskeletal:        General: Normal range of motion.  Skin:    General: Skin is warm.  Neurological:     Mental Status: She is alert.    Neurovascular status intact muscle strength adequate range of motion within normal limits with patient found to have large prominence around the first metatarsal head right that is red when pressed with moderate deviation of the hallux against the second toe with an incurvated medial border left hallux painful when pressed.  Patient is found to have good digital perfusion well oriented x3 with good range of motion     Assessment:  Significant structural bunion deformity right with pain and moderate changes of the hallux along with incurvated ingrown left hallux nail     Plan:  H&P reviewed condition at great length did discuss treatment options she is opted for surgery.  I do recommend a distal osteotomy with possible Akin osteotomy right and correction of ingrown toenail left and I did discuss and educate her on surgery.  She needs to wait till the first of the year due to her schedule so I will see her back mid December to go over the surgery and what exactly will be necessary and I did encourage her to come up with all  questions that she has  X-rays indicate significant elevation of the 1 2 intermetatarsal angle of approximate 15 degrees a deformity of the right big toe against the second toe

## 2021-04-22 ENCOUNTER — Telehealth: Payer: Self-pay | Admitting: Cardiovascular Disease

## 2021-04-22 ENCOUNTER — Other Ambulatory Visit: Payer: Self-pay

## 2021-04-22 MED ORDER — LOSARTAN POTASSIUM 25 MG PO TABS: 25.0000 mg | ORAL_TABLET | Freq: Every day | ORAL | 0 refills | Status: DC

## 2021-04-22 NOTE — Telephone Encounter (Signed)
-----   Message from Horton Finer sent at 04/22/2021  9:42 AM EST ----- Regarding: needs appointment Please schedule overdue F/U appointment. Thank you!

## 2021-04-22 NOTE — Telephone Encounter (Signed)
Attempted to schedule.  LMOV to call office.  ° °

## 2021-04-23 ENCOUNTER — Ambulatory Visit (INDEPENDENT_AMBULATORY_CARE_PROVIDER_SITE_OTHER): Payer: No Typology Code available for payment source | Admitting: Family Medicine

## 2021-04-23 ENCOUNTER — Other Ambulatory Visit: Payer: Self-pay

## 2021-04-23 ENCOUNTER — Encounter: Payer: Self-pay | Admitting: Family Medicine

## 2021-04-23 VITALS — BP 131/71 | HR 74 | Temp 98.6°F | Ht 68.0 in | Wt 190.0 lb

## 2021-04-23 DIAGNOSIS — M5030 Other cervical disc degeneration, unspecified cervical region: Secondary | ICD-10-CM

## 2021-04-23 DIAGNOSIS — Z0000 Encounter for general adult medical examination without abnormal findings: Secondary | ICD-10-CM | POA: Diagnosis not present

## 2021-04-23 DIAGNOSIS — Z1231 Encounter for screening mammogram for malignant neoplasm of breast: Secondary | ICD-10-CM | POA: Diagnosis not present

## 2021-04-23 DIAGNOSIS — N951 Menopausal and female climacteric states: Secondary | ICD-10-CM

## 2021-04-23 DIAGNOSIS — H6121 Impacted cerumen, right ear: Secondary | ICD-10-CM

## 2021-04-23 DIAGNOSIS — Z23 Encounter for immunization: Secondary | ICD-10-CM | POA: Diagnosis not present

## 2021-04-23 DIAGNOSIS — I10 Essential (primary) hypertension: Secondary | ICD-10-CM

## 2021-04-23 DIAGNOSIS — E782 Mixed hyperlipidemia: Secondary | ICD-10-CM

## 2021-04-23 DIAGNOSIS — Z6829 Body mass index (BMI) 29.0-29.9, adult: Secondary | ICD-10-CM

## 2021-04-23 MED ORDER — PHENTERMINE HCL 15 MG PO CAPS: 15.0000 mg | ORAL_CAPSULE | Freq: Every morning | ORAL | 3 refills | Status: DC

## 2021-04-23 MED ORDER — PROGESTERONE 200 MG PO CAPS: ORAL_CAPSULE | ORAL | 3 refills | Status: DC

## 2021-04-23 MED ORDER — ESTRADIOL 1 MG PO TABS: 1.0000 mg | ORAL_TABLET | Freq: Every day | ORAL | 5 refills | Status: DC

## 2021-04-23 NOTE — Progress Notes (Signed)
Complete physical exam   Patient: Tina Fuller   DOB: 1961/04/28   60 y.o. Female  MRN: [id number] Visit Date: 04/23/2021  Today's healthcare provider: Lelon Huh, MD   No chief complaint on file.  Subjective    Tina Fuller is a 60 y.o. female who presents today for a complete physical exam.  She reports consuming a general diet.  Exercises some.  She generally feels well. She reports sleeping well. She does have additional problems to discuss today.  HPI   Pt is reporting menopausal symptoms. (Night sweats)  Pt states her last period was in 02/2021.    Past Medical History:  Diagnosis Date   History of chicken pox    Low back pain    Muscle spasms of neck 08/19/2017   Palpitations    Trochanteric bursitis of left hip    Trochanteric bursitis of left hip 05/21/2015   Tubular adenoma of colon 08/2014   Past Surgical History:  Procedure Laterality Date   BREAST BIOPSY     Benign   COLONOSCOPY     COLONOSCOPY WITH PROPOFOL N/A 04/17/2020   Procedure: COLONOSCOPY WITH PROPOFOL;  Surgeon: [name], MD;  Location: [facility] ENDOSCOPY;  Service: Endoscopy;  Laterality: N/A;   fissurotomy     LIPOMA EXCISION Left    left side   Social History   Socioeconomic History   Marital status: Married    Spouse name: Not on file   Number of children: 2   Years of education: GED   Highest education level: Not on file  Occupational History   Occupation: Microbiologist: [employer]    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  Tobacco Use   Smoking status: Former    Packs/day: 0.25    Years: 8.00    Pack years: 2.00    Types: Cigarettes   Smokeless tobacco: Never   Tobacco comments:    smoked less than 1/2 ppd for 5-10 years   Vaping Use   Vaping Use: Never used  Substance and Sexual Activity   Alcohol use: Yes    Comment: occasional    Drug use: No   Sexual activity: Not on file  Other Topics Concern   Not on file  Social History Narrative    Divorced; full time; exercises regularly - walking    Social Determinants of Health   Financial Resource Strain: Not on file  Food Insecurity: Not on file  Transportation Needs: Not on file  Physical Activity: Not on file  Stress: Not on file  Social Connections: Not on file  Intimate Partner Violence: Not on file   Family Status  Relation Name Status   Mother  Deceased at age 22       HTN; non-insulin dependent diabetes, alzheimers   Father  Deceased at age 30       heart disease and CA   Sister  Deceased       CAD with a stent at 90    Sister  64   Brother  Alive       CHF and stroke   Brother  Alive       CAD in 13s   Neg Hx  (Not Specified)   Family History  Problem Relation Age of Onset   Hypertension Mother    Hyperlipidemia Mother    Diabetes Mother    Alzheimer's disease Mother    Cerebrovascular Accident Mother    Heart failure Father  Hyperlipidemia Father    Hypertension Father    Cerebrovascular Accident Father    Heart disease Sister    Diabetes Sister    Heart failure Sister    Heart failure Sister    Heart disease Brother    Hypertension Brother    Hyperlipidemia Brother    Diabetes Brother    Heart disease Brother    Hyperlipidemia Brother    Hypertension Brother    Breast cancer Neg Hx    Colon cancer Neg Hx    No Known Allergies  Patient Care Team: Birdie Sons, MD as PCP - General (Family Medicine) Rockey Situ, Kathlene November, MD as PCP - Cardiology (Cardiology) Manus Rudd, NP as Nurse Practitioner (Nurse Practitioner) Minna Merritts, MD as Consulting Physician (Cardiology) Odette Fraction [facility])   Medications: Outpatient Medications Prior to Visit  Medication Sig   aspirin EC 81 MG tablet Take 81 mg by mouth daily. Swallow whole.   cyclobenzaprine (FLEXERIL) 5 MG tablet Take 1-2 tablets (5-10 mg total) by mouth every evening. As needed for muscle aches   fluconazole (DIFLUCAN) 150 MG tablet Take 1 tablet (150 mg total)  by mouth daily.   losartan (COZAAR) 25 MG tablet Take 1 tablet (25 mg total) by mouth daily. PLEASE SCHEDULE OFFICE VISIT FOR FURTHER REFILLS. THANK YOU!   Naproxen Sodium (ALEVE PO) Take by mouth. PRN   NON FORMULARY Vita Fusion Takes 1 tablet qd.   phentermine 15 MG capsule Take 1 capsule (15 mg total) by mouth every morning.   predniSONE (DELTASONE) 10 MG tablet 6 tab x 2 day, 5 x 2d, 4 x 2 d, 3 x 2d, 2 x 2d, then 1 x 2d   topiramate (TOPAMAX) 50 MG tablet Take 1 tablet (50 mg total) by mouth daily.   No facility-administered medications prior to visit.    Review of Systems  Constitutional:  Positive for diaphoresis (Night Sweats) and fatigue. Negative for activity change, appetite change, chills, fever and unexpected weight change.  HENT:  Positive for tinnitus. Negative for congestion, dental problem, drooling, ear discharge, ear pain, facial swelling, hearing loss, mouth sores, nosebleeds, postnasal drip, rhinorrhea, sinus pressure, sinus pain, sneezing, sore throat, trouble swallowing and voice change.   Eyes:  Positive for photophobia. Negative for pain, discharge, redness, itching and visual disturbance.  Respiratory: Negative.    Cardiovascular: Negative.   Gastrointestinal: Negative.   Endocrine: Negative.   Genitourinary: Negative.   Musculoskeletal:  Positive for arthralgias, back pain, neck pain and neck stiffness. Negative for gait problem, joint swelling and myalgias.  Skin: Negative.   Allergic/Immunologic: Negative.   Neurological: Negative.   Hematological: Negative.   Psychiatric/Behavioral: Negative.       Objective    BP 131/71 (BP Location: Left Arm, Patient Position: Sitting, Cuff Size: Large)   Pulse 74   Temp 98.6 F (37 C) (Oral)   Ht 5\' 8"  (1.727 m)   Wt 190 lb (86.2 kg)   SpO2 100%   BMI 28.89 kg/m    Physical Exam   General Appearance:     Overweight female. Alert, cooperative, in no acute distress, appears stated age   Head:     Normocephalic, without obvious abnormality, atraumatic  Eyes:    PERRL, conjunctiva/corneas clear, EOM's intact, fundi    benign, both eyes  Ears:    Normal TM's. Right cerumen impaction  Neck:   Supple, symmetrical, trachea midline, no adenopathy;    thyroid:  no enlargement/tenderness/nodules; no carotid   bruit  or JVD  Back:     Symmetric, no curvature, ROM normal, no CVA tenderness  Lungs:     Clear to auscultation bilaterally, respirations unlabored  Chest Wall:    No tenderness or deformity   Heart:    Normal heart rate. Normal rhythm. No murmurs, rubs, or gallops.    Breast Exam:    normal appearance, no masses or tenderness  Abdomen:     Soft, non-tender, bowel sounds active all four quadrants,    no masses, no organomegaly  Pelvic:    deferred  Extremities:   All extremities are intact. No cyanosis or edema  Pulses:   2+ and symmetric all extremities  Skin:   Skin color, texture, turgor normal, no rashes or lesions  Lymph nodes:   Cervical, supraclavicular, and axillary nodes normal  Neurologic:   CNII-XII intact, normal strength, sensation and reflexes    throughout     Last depression screening scores PHQ 2/9 Scores 01/02/2021 03/02/2020 09/01/2018  PHQ - 2 Score 5 1 0  PHQ- 9 Score 9 - 2   Last fall risk screening Fall Risk  03/02/2020  Falls in the past year? 0  Number falls in past yr: 0  Injury with Fall? 0  Follow up Falls evaluation completed   Last Audit-C alcohol use screening Alcohol Use Disorder Test (AUDIT) 01/02/2021  1. How often do you have a drink containing alcohol? 3  2. How many drinks containing alcohol do you have on a typical day when you are drinking? 0  3. How often do you have six or more drinks on one occasion? 0  AUDIT-C Score 3  4. How often during the last year have you found that you were not able to stop drinking once you had started? 0  5. How often during the last year have you failed to do what was normally expected from you because of  drinking? 0  6. How often during the last year have you needed a first drink in the morning to get yourself going after a heavy drinking session? 0  7. How often during the last year have you had a feeling of guilt of remorse after drinking? 0  8. How often during the last year have you been unable to remember what happened the night before because you had been drinking? 0  9. Have you or someone else been injured as a result of your drinking? 0  10. Has a relative or friend or a doctor or another health worker been concerned about your drinking or suggested you cut down? 0  Alcohol Use Disorder Identification Test Final Score (AUDIT) 3  Alcohol Brief Interventions/Follow-up -   A score of 3 or more in women, and 4 or more in men indicates increased risk for alcohol abuse, EXCEPT if all of the points are from question 1   No results found for any visits on 04/23/21.  Assessment & Plan    Routine Health Maintenance and Physical Exam  Exercise Activities and Dietary recommendations  Goals   None     Immunization History  Administered Date(s) Administered   Influenza,inj,Quad PF,6+ Mos 03/02/2020   PFIZER(Purple Top)SARS-COV-2 Vaccination 09/10/2019, 10/08/2019   Td 07/02/2017   Tdap 11/18/2006   Zoster Recombinat (Shingrix) 07/02/2017, 09/14/2017    Health Maintenance  Topic Date Due   HIV Screening  Never done   COVID-19 Vaccine (3 - Booster for [place] series) 12/03/2019   INFLUENZA VACCINE  01/14/2021   MAMMOGRAM  04/27/2022  PAP SMEAR-Modifier  07/02/2022   COLONOSCOPY (Pts 45-77yrs Insurance coverage will need to be confirmed)  04/18/2027   TETANUS/TDAP  07/03/2027   Hepatitis C Screening  Completed   Zoster Vaccines- Shingrix  Completed   Pneumococcal Vaccine 79-77 Years old  Aged Out   HPV VACCINES  Aged Out    Discussed health benefits of physical activity, and encouraged her to engage in regular exercise appropriate for her age and condition.  1. Encounter for  screening mammogram for malignant neoplasm of breast  - MM 3D SCREEN BREAST BILATERAL  2. Need for influenza vaccination  - Flu Vaccine QUAD 6+ mos PF IM (Fluarix Quad PF)  3. Essential hypertension Well controlled.  Continue current medications.   - TSH  4. Mixed hyperlipidemia Diet controlled.  - CBC - Comprehensive metabolic panel - Lipid panel - TSH  5. Impacted cerumen, right ear After soaking with Debrox, ear canal was irrigated with water until clear. Patient tolerated procedure well.    6. Adult BMI 29.0-29.9 kg/sq m Doing well taking phentermine intermittently. refill phentermine 15 MG capsule; Take 1 capsule (15 mg total) by mouth every morning.  Dispense: 30 capsule; Refill: 3  7. Perimenopause start - estradiol (ESTRACE) 1 MG tablet; Take 1 tablet (1 mg total) by mouth daily.  Dispense: 30 tablet; Refill: 5 - progesterone (PROMETRIUM) 200 MG capsule; Take 1 tablet daily for 12 days each month.  Dispense: 30 capsule; Refill: 3  8. DDD (degenerative disc disease), cervical stable     The entirety of the information documented in the History of Present Illness, Review of Systems and Physical Exam were personally obtained by me. Portions of this information were initially documented by the CMA and reviewed by me for thoroughness and accuracy.     Lelon Huh, MD  [facility] [phone] (phone) [phone] (fax)  [place]

## 2021-04-24 ENCOUNTER — Other Ambulatory Visit: Payer: Self-pay | Admitting: Family Medicine

## 2021-04-25 LAB — CBC
Hematocrit: 41.5 % (ref 34.0–46.6)
Hemoglobin: 13.8 g/dL (ref 11.1–15.9)
MCH: 29.7 pg (ref 26.6–33.0)
MCHC: 33.3 g/dL (ref 31.5–35.7)
MCV: 89 fL (ref 79–97)
Platelets: 331 10*3/uL (ref 150–450)
RBC: 4.64 x10E6/uL (ref 3.77–5.28)
RDW: 12.4 % (ref 11.7–15.4)
WBC: 3.5 10*3/uL (ref 3.4–10.8)

## 2021-04-25 LAB — TSH: TSH: 0.477 u[IU]/mL (ref 0.450–4.500)

## 2021-04-25 LAB — COMPREHENSIVE METABOLIC PANEL
ALT: 17 IU/L (ref 0–32)
AST: 15 IU/L (ref 0–40)
Albumin/Globulin Ratio: 1.8 (ref 1.2–2.2)
Albumin: 4.4 g/dL (ref 3.8–4.9)
Alkaline Phosphatase: 77 IU/L (ref 44–121)
BUN/Creatinine Ratio: 16 (ref 9–23)
BUN: 12 mg/dL (ref 6–24)
Bilirubin Total: 0.3 mg/dL (ref 0.0–1.2)
CO2: 23 mmol/L (ref 20–29)
Calcium: 11.9 mg/dL — ABNORMAL HIGH (ref 8.7–10.2)
Chloride: 105 mmol/L (ref 96–106)
Creatinine, Ser: 0.75 mg/dL (ref 0.57–1.00)
Globulin, Total: 2.4 g/dL (ref 1.5–4.5)
Glucose: 88 mg/dL (ref 70–99)
Potassium: 4.9 mmol/L (ref 3.5–5.2)
Sodium: 138 mmol/L (ref 134–144)
Total Protein: 6.8 g/dL (ref 6.0–8.5)
eGFR: 92 mL/min/{1.73_m2} (ref >59)

## 2021-04-25 LAB — LIPID PANEL
Chol/HDL Ratio: 2.8 ratio (ref 0.0–4.4)
Cholesterol, Total: 231 mg/dL — ABNORMAL HIGH (ref 100–199)
HDL: 82 mg/dL (ref >39)
LDL Chol Calc (NIH): 138 mg/dL — ABNORMAL HIGH (ref 0–99)
Triglycerides: 64 mg/dL (ref 0–149)
VLDL Cholesterol Cal: 11 mg/dL (ref 5–40)

## 2021-05-03 ENCOUNTER — Other Ambulatory Visit: Payer: Self-pay

## 2021-05-03 ENCOUNTER — Ambulatory Visit (INDEPENDENT_AMBULATORY_CARE_PROVIDER_SITE_OTHER): Payer: No Typology Code available for payment source | Admitting: Cardiovascular Disease

## 2021-05-03 ENCOUNTER — Encounter: Payer: Self-pay | Admitting: Cardiovascular Disease

## 2021-05-03 VITALS — BP 130/70 | HR 84 | Ht 68.0 in | Wt 200.0 lb

## 2021-05-03 DIAGNOSIS — R600 Localized edema: Secondary | ICD-10-CM | POA: Diagnosis not present

## 2021-05-03 DIAGNOSIS — R002 Palpitations: Secondary | ICD-10-CM | POA: Diagnosis not present

## 2021-05-03 DIAGNOSIS — I10 Essential (primary) hypertension: Secondary | ICD-10-CM | POA: Diagnosis not present

## 2021-05-03 DIAGNOSIS — E782 Mixed hyperlipidemia: Secondary | ICD-10-CM | POA: Diagnosis not present

## 2021-05-03 DIAGNOSIS — Z8342 Family history of familial hypercholesterolemia: Secondary | ICD-10-CM

## 2021-05-03 MED ORDER — LOSARTAN POTASSIUM 50 MG PO TABS: 50.0000 mg | ORAL_TABLET | Freq: Every day | ORAL | 3 refills | Status: DC

## 2021-05-03 NOTE — Patient Instructions (Addendum)
Medication Instructions:  Please START Losartan 50 mg daily  If you need a refill on your cardiac medications before your next appointment, please call your pharmacy.   Lab work: No new labs needed  Testing/Procedures: We have order a CT coronary calcium score (you may see results on your MyChart account, but we will call via phone with the results) This is a $99 out of pocket expense   This procedure uses special x-ray equipment to produce pictures of the coronary arteries to determine if they are blocked or narrowed by the buildup of plaque - an indicator for atherosclerosis or coronary artery disease (CAD).  Please call [phone] to schedule at your earliest convince   This is done at our [facility] in [facility] [place], [place] [address]    Follow-Up: At [facility], you and your health needs are our priority.  As part of our continuing mission to provide you with exceptional heart care, we have created designated Provider Care Teams.  These Care Teams include your primary Cardiologist (physician) and Advanced Practice Providers (APPs -  Physician Assistants and Nurse Practitioners) who all work together to provide you with the care you need, when you need it.  You will need a follow up appointment as needed  Providers on your designated Care Team:   Murray Hodgkins, NP Christell Faith, PA-C Cadence Kathlen Mody, [place]  COVID-19 Vaccine Information can be found at: [email or web address] For questions related to vaccine distribution or appointments, please email [email or web address] or call [phone].

## 2021-05-03 NOTE — Progress Notes (Signed)
Cardiology Office Note  Date:  05/03/2021   ID:  Zanai, Mallari [birth year], MRN [id number]  PCP:  Birdie Sons, MD   Chief Complaint  Patient presents with   6 month follow up     "Doing well." Medications reviewed by the patient verbally.      HPI:  60 year old woman with  obesity,    smoke but stopped approximately 15 years ago. no known cardiac history,  family history of diabetes and heart disease  seen in 2011 for tachycardia, chest tightness.  She presents for routine followup of her blood pressure, risk factors tachycardia,, leg pain, arm pain  Last seen in the office by myself in 2019 Seen by one of our providers March 2022  Started on losartan in 2021  ABIs negative for PAD working on weight loss with assistance of her PCP taking phenteramine and topiramate  In follow-up today no regular exercise program Weight trending upwards  Denies chest pain or shortness of breath concerning for angina Blood pressure running little bit high at home typically 1 30-1 40  Lab work reviewed Total chol 231, LDL 138    EKG personally reviewed by myself on todays visit Shows normal sinus rhythm rate 84 bpm no significant ST or T wave changes   PMH:   has a past medical history of History of chicken pox, Low back pain, Muscle spasms of neck (08/19/2017), Palpitations, Trochanteric bursitis of left hip, Trochanteric bursitis of left hip (05/21/2015), and Tubular adenoma of colon (08/2014).  PSH:    Past Surgical History:  Procedure Laterality Date   BREAST BIOPSY     Benign   COLONOSCOPY     COLONOSCOPY WITH PROPOFOL N/A 04/17/2020   Procedure: COLONOSCOPY WITH PROPOFOL;  Surgeon: [name], MD;  Location: [facility] ENDOSCOPY;  Service: Endoscopy;  Laterality: N/A;   fissurotomy     LIPOMA EXCISION Left    left side    Current Outpatient Medications  Medication Sig Dispense Refill   cyclobenzaprine (FLEXERIL) 5 MG tablet Take 1-2 tablets (5-10 mg total) by  mouth every evening. As needed for muscle aches 30 tablet 1   estradiol (ESTRACE) 1 MG tablet Take 1 tablet (1 mg total) by mouth daily. 30 tablet 5   losartan (COZAAR) 25 MG tablet Take 1 tablet (25 mg total) by mouth daily. PLEASE SCHEDULE OFFICE VISIT FOR FURTHER REFILLS. THANK YOU! 30 tablet 0   Naproxen Sodium (ALEVE PO) Take by mouth. PRN     phentermine 15 MG capsule Take 1 capsule (15 mg total) by mouth every morning. 30 capsule 3   progesterone (PROMETRIUM) 200 MG capsule Take 1 tablet daily for 12 days each month. 30 capsule 3   topiramate (TOPAMAX) 50 MG tablet Take 1 tablet (50 mg total) by mouth daily. 30 tablet 3   fluconazole (DIFLUCAN) 150 MG tablet Take 1 tablet (150 mg total) by mouth daily. (Patient not taking: Reported on 05/03/2021) 1 tablet 0   NON FORMULARY Vita Fusion Takes 1 tablet qd. (Patient not taking: Reported on 05/03/2021)     No current facility-administered medications for this visit.     Allergies:   Bee pollen   Social History:  The patient  reports that she has quit smoking. Her smoking use included cigarettes. She has a 2.00 pack-year smoking history. She has never used smokeless tobacco. She reports current alcohol use. She reports that she does not use drugs.   Family History:   family history includes Alzheimer's disease  in her mother; Cerebrovascular Accident in her father and mother; Diabetes in her brother, mother, and sister; Heart disease in her brother, brother, and sister; Heart failure in her father, sister, and sister; Hyperlipidemia in her brother, brother, father, and mother; Hypertension in her brother, brother, father, and mother.    Review of Systems: Review of Systems  Constitutional: Negative.   Respiratory: Negative.    Cardiovascular: Negative.   Gastrointestinal: Negative.   Musculoskeletal:  Positive for myalgias.       Leg pain, posterior shoulders, back of the head  Neurological: Negative.   Psychiatric/Behavioral: Negative.     All other systems reviewed and are negative.   PHYSICAL EXAM: VS:  BP 130/70 (BP Location: Left Arm, Patient Position: Sitting, Cuff Size: Normal)   Pulse 84   Ht 5\' 8"  (1.727 m)   Wt 200 lb (90.7 kg)   SpO2 98%   BMI 30.41 kg/m  , BMI Body mass index is 30.41 kg/m. Constitutional:  oriented to person, place, and time. No distress.  HENT:  Head: Grossly normal Eyes:  no discharge. No scleral icterus.  Neck: No JVD, no carotid bruits  Cardiovascular: Regular rate and rhythm, no murmurs appreciated Pulmonary/Chest: Clear to auscultation bilaterally, no wheezes or rails Abdominal: Soft.  no distension.  no tenderness.  Musculoskeletal: Normal range of motion Neurological:  normal muscle tone. Coordination normal. No atrophy Skin: Skin warm and dry Psychiatric: normal affect, pleasant   Recent Labs: 04/24/2021: ALT 17; BUN 12; Creatinine, Ser 0.75; Hemoglobin 13.8; Platelets 331; Potassium 4.9; Sodium 138; TSH 0.477    Lipid Panel Lab Results  Component Value Date   CHOL 231 (H) 04/24/2021   HDL 82 04/24/2021   LDLCALC 138 (H) 04/24/2021   TRIG 64 04/24/2021      Wt Readings from Last 3 Encounters:  05/03/21 200 lb (90.7 kg)  04/23/21 190 lb (86.2 kg)  01/02/21 197 lb (89.4 kg)       ASSESSMENT AND PLAN:  Palpitations - Plan: EKG 12-Lead Stable, no further work-up at this time  Pure hypercholesterolemia Numbers have trended higher, discussed various treatment options We have ordered CT coronary calcium scoring for risk stratification She prefers to hold off on statin until results of calcium scoring are available  Shortness of breath Weight loss, calorie restriction, exercise program recommended  Paravertebral muscle spasms Left neck, left posterior shoulder May need chiropractic, icing, heat pack, light stretching, avoiding repetitive motion, NSAIDs as needed  Essential hypertension Increase losartan up to 50 daily, monitor blood pressure at home    Total encounter time more than 25 minutes  Greater than 50% was spent in counseling and coordination of care with the patient    No orders of the defined types were placed in this encounter.    Signed, Esmond Plants, M.D., Ph.D. 05/03/2021  [place], [place]

## 2021-05-29 ENCOUNTER — Ambulatory Visit (INDEPENDENT_AMBULATORY_CARE_PROVIDER_SITE_OTHER): Payer: No Typology Code available for payment source | Admitting: Podiatry

## 2021-05-29 ENCOUNTER — Other Ambulatory Visit: Payer: Self-pay

## 2021-05-29 ENCOUNTER — Encounter: Payer: Self-pay | Admitting: Podiatry

## 2021-05-29 DIAGNOSIS — M21611 Bunion of right foot: Secondary | ICD-10-CM | POA: Diagnosis not present

## 2021-05-29 DIAGNOSIS — L600 Ingrowing nail: Secondary | ICD-10-CM

## 2021-05-29 DIAGNOSIS — M2011 Hallux valgus (acquired), right foot: Secondary | ICD-10-CM

## 2021-05-30 NOTE — Progress Notes (Signed)
Subjective:   Patient ID: Tina Fuller, female   DOB: 60 y.o.   MRN: [id number]   HPI Patient presents stating she is ready to have her bunion fixed in the next several weeks and states that its been very sore.  Patient also states the second toe is long and gets irritated when she tries to wear shoes and she like to have that addressed at the same time   ROS      Objective:  Physical Exam  Neuro vascular status was found to be intact excellent digital perfusion with patient noted to have moderate to severe bunion deformity right that is failed to respond to wider shoes soaks and padding.  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  I have recommended distal osteotomy right first metatarsal with probable Akin osteotomy and digital fusion digit to right.  I allowed her to read consent form going over alternative treatments complications and patient does understand all of this wants surgery and after extensive review signed consent form.  Patient is scheduled for outpatient surgery does understand total recovery is approximate 6 months and that there will be a pin in the second toe for 5 weeks.  Patient had air fracture walker dispensed with all instructions on usage and I want her wearing this to get used to it prior to surgery and finding a shoe on the other foot that fits her properly so she will be able to bear weight during the postoperative period

## 2021-06-04 ENCOUNTER — Telehealth: Payer: Self-pay | Admitting: Urology

## 2021-06-04 NOTE — Telephone Encounter (Addendum)
DOS - 07/09/21  [place] BUNIONECTOMY RIGHT --- 18367 POSS [place] OSTEOTOMY RIGHT --- 25500 HAM[place] REPAIR 2ND RIGHT --- 16429   AETNA EFFECTIVE DATE - 07/17/13  PER AETNA'S AUTOMATIVE SYSTEM FOR CPT CODES 03795, 58316 AND 74255 NO PRIOR AUTH IS REQUIRED.  REF # [id number] REF # [id number]

## 2021-06-28 ENCOUNTER — Ambulatory Visit: Payer: Self-pay | Admitting: *Deleted

## 2021-06-28 NOTE — Telephone Encounter (Signed)
° ° °  Chief Complaint: Sinus pain Symptoms: Pain right eye with puffiness, sinus drainage, Frequency: Started 2 days ago Pertinent Negatives: Patient denies fever Disposition: [] ED /[] Urgent Care (no appt availability in office) / [] Appointment(In office/virtual)/ []  [place] [facility]/ [] Home Care/ [] Refused Recommended Disposition /[] [facility]/ []  Follow-up with PCP Additional Notes: Declines visit, although there is no availability. Asking for an antibiotic to be called in. Instructed she would need a visit. Wants to know if PCP will call "something in." Encourage pt. To do My Chart e-visit. Please advise pt.   Answer Assessment - Initial Assessment Questions 1. LOCATION: "Where does it hurt?"      Headache, right eye 2. ONSET: "When did the sinus pain start?"  (e.g., hours, days)      1-2 days ago 3. SEVERITY: "How bad is the pain?"   (Scale 1-10; mild, moderate or severe)   - MILD (1-3): doesn't interfere with normal activities    - MODERATE (4-7): interferes with normal activities (e.g., work or school) or awakens from sleep   - SEVERE (8-10): excruciating pain and patient unable to do any normal activities        5 4. RECURRENT SYMPTOM: "Have you ever had sinus problems before?" If Yes, ask: "When was the last time?" and "What happened that time?"      Yes 5. NASAL CONGESTION: "Is the nose blocked?" If Yes, ask: "Can you open it or must you breathe through your mouth?"     Runny nose 6. NASAL DISCHARGE: "Do you have discharge from your nose?" If so ask, "What color?"     White 7. FEVER: "Do you have a fever?" If Yes, ask: "What is it, how was it measured, and when did it start?"      No 8. OTHER SYMPTOMS: "Do you have any other symptoms?" (e.g., sore throat, cough, earache, difficulty breathing)     Right puffy 9. PREGNANCY: "Is there any chance you are pregnant?" "When was your last menstrual period?"     No  Protocols used: Sinus Pain or Congestion-A-AH

## 2021-06-28 NOTE — Telephone Encounter (Signed)
Pt advised.  She is going to go to urgent care.   Thanks,   -Mickel Baas

## 2021-06-28 NOTE — Telephone Encounter (Signed)
Summary: sinus infection/ mediaction wanted   Pt asked for medication for a sinus infection to be called in without her coming in/ she has stuffy head, watery and itchy eyes, sneezing and runny nose / please advise     Attempted to call patient- left message to call office

## 2021-06-28 NOTE — Telephone Encounter (Signed)
Second attempt to call patient- left message to call office. 

## 2021-06-28 NOTE — Telephone Encounter (Signed)
Recommend virtual urgent care visit.

## 2021-07-01 ENCOUNTER — Encounter: Payer: No Typology Code available for payment source | Admitting: Podiatry

## 2021-07-08 ENCOUNTER — Other Ambulatory Visit: Payer: Self-pay | Admitting: Podiatry

## 2021-07-08 MED ORDER — ONDANSETRON HCL 4 MG PO TABS: 4.0000 mg | ORAL_TABLET | Freq: Three times a day (TID) | ORAL | 0 refills | Status: DC | PRN

## 2021-07-08 MED ORDER — OXYCODONE-ACETAMINOPHEN 10-325 MG PO TABS: 1.0000 | ORAL_TABLET | ORAL | 0 refills | Status: DC | PRN

## 2021-07-08 NOTE — Addendum Note (Signed)
Addended by: Wallene Huh on: 07/08/2021 01:25 PM   Modules accepted: Orders

## 2021-07-08 NOTE — Telephone Encounter (Signed)
Please advise 

## 2021-07-09 ENCOUNTER — Telehealth: Payer: Self-pay | Admitting: *Deleted

## 2021-07-09 ENCOUNTER — Encounter: Payer: Self-pay | Admitting: Podiatry

## 2021-07-09 DIAGNOSIS — M2041 Other hammer toe(s) (acquired), right foot: Secondary | ICD-10-CM | POA: Diagnosis not present

## 2021-07-09 DIAGNOSIS — M2011 Hallux valgus (acquired), right foot: Secondary | ICD-10-CM | POA: Diagnosis not present

## 2021-07-09 NOTE — Telephone Encounter (Signed)
Christie w/ [phone]) is calling for clarification on a prescription (oxycodone-ace,10-325 mg)sent, higher than what they are allowed to fill. Please advise.

## 2021-07-10 NOTE — Telephone Encounter (Signed)
Patient received her pain medication this morning and is doing ok, resting and elevating. She said that someone has contacted her,no message left but she will be at her upcoming appointment 07/15/21.

## 2021-07-15 ENCOUNTER — Other Ambulatory Visit: Payer: Self-pay

## 2021-07-15 ENCOUNTER — Encounter: Payer: Self-pay | Admitting: Podiatry

## 2021-07-15 ENCOUNTER — Ambulatory Visit (INDEPENDENT_AMBULATORY_CARE_PROVIDER_SITE_OTHER): Payer: No Typology Code available for payment source

## 2021-07-15 ENCOUNTER — Ambulatory Visit (INDEPENDENT_AMBULATORY_CARE_PROVIDER_SITE_OTHER): Payer: No Typology Code available for payment source | Admitting: Podiatry

## 2021-07-15 DIAGNOSIS — Z9889 Other specified postprocedural states: Secondary | ICD-10-CM | POA: Diagnosis not present

## 2021-07-15 DIAGNOSIS — M2011 Hallux valgus (acquired), right foot: Secondary | ICD-10-CM

## 2021-07-16 ENCOUNTER — Other Ambulatory Visit: Payer: Self-pay | Admitting: Podiatry

## 2021-07-16 ENCOUNTER — Telehealth: Payer: Self-pay | Admitting: *Deleted

## 2021-07-16 MED ORDER — HYDROCODONE-ACETAMINOPHEN 10-325 MG PO TABS
1.0000 | ORAL_TABLET | Freq: Three times a day (TID) | ORAL | 0 refills | Status: AC | PRN
Start: 2021-07-16 — End: 2021-07-21

## 2021-07-16 NOTE — Telephone Encounter (Signed)
Patient is calling for pain medicine refill. Please advise.

## 2021-07-16 NOTE — Progress Notes (Signed)
Subjective:   Patient ID: Tina Fuller, female   DOB: 61 y.o.   MRN: [id number]   HPI Listed below pleased with surgery so far and wearing her boot to the office to   ROS      Objective:  Physical Exam  Patient states she is doing very well with surgery very pleased with her results and states that she has some soreness but overall minimal discomfort     Assessment:  Neurovascular status intact negative Bevelyn Buckles' sign noted wound edges well coapted hallux in rectus position fixation in place and good alignment of the big toe joint     Plan:  Well forefoot reconstruction right foot very pleased.  X-rays reviewed with patient sterile dressing reapplied and continue with range of motion exercises and start with this in a slow fashion and continue with boot usage and reappoint 2 weeks suture removal earlier if needed  X-rays indicate osteotomies are healing very well good alignment noted no pathology currently

## 2021-07-16 NOTE — Telephone Encounter (Signed)
done

## 2021-07-17 DIAGNOSIS — M79676 Pain in unspecified toe(s): Secondary | ICD-10-CM

## 2021-07-17 NOTE — Telephone Encounter (Signed)
Patient has been notified thru vmessage.

## 2021-07-24 ENCOUNTER — Ambulatory Visit: Payer: No Typology Code available for payment source | Attending: Cardiovascular Disease

## 2021-08-05 ENCOUNTER — Other Ambulatory Visit: Payer: Self-pay

## 2021-08-05 ENCOUNTER — Ambulatory Visit (INDEPENDENT_AMBULATORY_CARE_PROVIDER_SITE_OTHER): Payer: No Typology Code available for payment source

## 2021-08-05 DIAGNOSIS — Z9889 Other specified postprocedural states: Secondary | ICD-10-CM

## 2021-08-05 NOTE — Progress Notes (Signed)
Patient in office today for suture removal and x-ray. Patient denies nausea, vomiting, fever, and chill. Suture removed from 4th digit without complications. Sterile dressing applied to right foot with compression. Advised patient to call the office with any questions, comments, or concerns. Patient verbalized understanding. Patient to see Dr. Paulla Dolly in 2 weeks for Pin removal.

## 2021-08-14 ENCOUNTER — Encounter (HOSPITAL_COMMUNITY): Payer: Self-pay

## 2021-08-19 ENCOUNTER — Other Ambulatory Visit: Payer: Self-pay

## 2021-08-19 ENCOUNTER — Encounter: Payer: Self-pay | Admitting: Podiatry

## 2021-08-19 ENCOUNTER — Ambulatory Visit (INDEPENDENT_AMBULATORY_CARE_PROVIDER_SITE_OTHER): Payer: No Typology Code available for payment source

## 2021-08-19 ENCOUNTER — Ambulatory Visit (INDEPENDENT_AMBULATORY_CARE_PROVIDER_SITE_OTHER): Payer: No Typology Code available for payment source | Admitting: Podiatry

## 2021-08-19 DIAGNOSIS — Z9889 Other specified postprocedural states: Secondary | ICD-10-CM | POA: Diagnosis not present

## 2021-08-19 NOTE — Progress Notes (Signed)
Subjective:  ? ?Patient ID: Tina Fuller, female   DOB: 61 y.o.   MRN: [id number]  ? ?HPI ?Patient presents stating that she is doing well with the right foot ready to have the pin removed overall pleased ? ? ?ROS ? ? ?   ?Objective:  ?Physical Exam  ?Neurovascular status intact with patient's right foot healing well wound edges well coapted pin intact second toe alignment of second digit good ? ?   ?Assessment:  ?Doing well post forefoot reconstruction right ? ?   ?Plan:  ?H&P reviewed condition removed pin second digit applied sterile dressing and reviewed x-ray.  Gradually return to soft shoe gear reappoint to recheck ? ?X-rays indicate osteotomy fixation doing well with second toe in good alignment currently.  Reappoint to recheck ?   ? ?

## 2021-09-30 ENCOUNTER — Ambulatory Visit (INDEPENDENT_AMBULATORY_CARE_PROVIDER_SITE_OTHER): Payer: No Typology Code available for payment source | Admitting: Podiatry

## 2021-09-30 ENCOUNTER — Encounter: Payer: Self-pay | Admitting: Podiatry

## 2021-09-30 ENCOUNTER — Ambulatory Visit (INDEPENDENT_AMBULATORY_CARE_PROVIDER_SITE_OTHER): Payer: No Typology Code available for payment source

## 2021-09-30 DIAGNOSIS — M2011 Hallux valgus (acquired), right foot: Secondary | ICD-10-CM

## 2021-09-30 DIAGNOSIS — M21611 Bunion of right foot: Secondary | ICD-10-CM

## 2021-09-30 NOTE — Progress Notes (Signed)
Subjective:  ? ?Patient ID: Tina Fuller, female   DOB: 61 y.o.   MRN: [id number]  ? ?HPI ?Patient states doing very well postoperatively very pleased with surgery ? ? ?ROS ? ? ?   ?Objective:  ?Physical Exam  ?Neurovascular status intact negative Bevelyn Buckles' sign noted right foot healing well wound edges well coapted good alignment first MPJ reasonable range of motion no crepitus ? ?   ?Assessment:  ?Doing well post osteotomy right first metatarsal digital fusion second right  ? ?   ?Plan:  ?H&P x-ray reviewed and at this point recommended the continuation of conservative care continuation of wider shoe gear and gradual increase in activity levels. ? ?X-rays indicate excellent healing osteotomy fixation in place toe in good alignment ?   ? ? ?

## 2021-10-14 ENCOUNTER — Telehealth: Payer: Self-pay | Admitting: Physician Assistant

## 2021-10-14 ENCOUNTER — Telehealth: Payer: Self-pay

## 2021-10-14 ENCOUNTER — Ambulatory Visit (INDEPENDENT_AMBULATORY_CARE_PROVIDER_SITE_OTHER): Payer: No Typology Code available for payment source | Admitting: Physician Assistant

## 2021-10-14 ENCOUNTER — Encounter: Payer: Self-pay | Admitting: Physician Assistant

## 2021-10-14 VITALS — BP 124/76 | HR 114 | Temp 102.6°F | Resp 16 | Wt 201.0 lb

## 2021-10-14 DIAGNOSIS — R399 Unspecified symptoms and signs involving the genitourinary system: Secondary | ICD-10-CM

## 2021-10-14 DIAGNOSIS — K5900 Constipation, unspecified: Secondary | ICD-10-CM | POA: Diagnosis not present

## 2021-10-14 DIAGNOSIS — R310 Gross hematuria: Secondary | ICD-10-CM

## 2021-10-14 DIAGNOSIS — R102 Pelvic and perineal pain: Secondary | ICD-10-CM

## 2021-10-14 DIAGNOSIS — R1031 Right lower quadrant pain: Secondary | ICD-10-CM | POA: Diagnosis not present

## 2021-10-14 LAB — POCT URINALYSIS DIPSTICK
Bilirubin, UA: NEGATIVE
Glucose, UA: NEGATIVE
Ketones, UA: NEGATIVE
Nitrite, UA: NEGATIVE
Protein, UA: POSITIVE — AB
Spec Grav, UA: 1.02 (ref 1.010–1.025)
Urobilinogen, UA: 0.2 E.U./dL
pH, UA: 6 (ref 5.0–8.0)

## 2021-10-14 NOTE — Telephone Encounter (Signed)
Copied from [place]. Topic: General - Other ?>> Oct 14, 2021  4:25 PM Tina Fuller wrote: ?Reason for CRM: Pt called to see if she needed any paperwork/ she stated she was in the office today and sent to the ER but wasn't given any paperwork/ please advise ?

## 2021-10-14 NOTE — Telephone Encounter (Signed)
Message was left to patient regarding paperwork for ER

## 2021-10-14 NOTE — Progress Notes (Signed)
?  ? ?I,Gussie Towson Robinson,acting as a Education administrator for [employer], PA-C.,have documented all relevant documentation on the behalf of [name], PA-C,as directed by  [employer], PA-C while in the presence of [employer], PA-C. ? ? ?Established patient visit ? ? ?Patient: Tina Fuller   DOB: February 14, 1961   61 y.o. Female  MRN: [id number] ?Visit Date: 10/14/2021 ? ?Today's healthcare provider: [name], PA-C  ? ?CC Urinary symptoms ? ?Subjective  ?  ?Urinary symptoms ? ?She reports right side pelvic pain and urinary pressure with frequency. The current episode started about a week ago and is gradually improving. Patient states symptoms are moderate in intensity, occurring "about every other day I get a pain."   She  has not been recently treated for similar symptoms.  Reports drinking plenty of water.  States she has history of fibroids.   ?  ? ?Associated symptoms: ?No abdominal pain Yes back pain  ?No chills No constipation  ?Yes cramping No diarrhea  ?No discharge No fever  ?No hematuria Yes nausea  ?No vomiting   ? ?Prior of current symptoms, patient endorses having "chalky, milky discharge" ?Denies taking antibiotics recently ? ?Endorses having blood in urine. Denies having hx of kidney stones. She is perimenopausal and has been having irregular menses and she is not sure if blood in urine due to her period.  ? ?Has been having chronic back pain and takes pain medications ? ?Reports having seasonal allergies with nasal congestion, postnasal drip and itchy eyes. ? ?Denis having hx of appendicitis. Denies having problems with appetite  ? ? ?Sexual activity: with her husband ?Contraception: yes ?History STDs: denies ?Previous pap smear: 07/02/17 normal , HPV negative ? ?Medications: ?Outpatient Medications Prior to Visit  ?Medication Sig  ? fluconazole (DIFLUCAN) 150 MG tablet Take 1 tablet (150 mg total) by mouth daily.  ? losartan (COZAAR) 50 MG tablet Take 1 tablet (50 mg total) by mouth daily. PLEASE  SCHEDULE OFFICE VISIT FOR FURTHER REFILLS. THANK YOU!  ? Naproxen Sodium (ALEVE PO) Take by mouth. PRN  ? NON FORMULARY Vita Fusion Takes 1 tablet qd.  ? phentermine 15 MG capsule Take 1 capsule (15 mg total) by mouth every morning.  ? topiramate (TOPAMAX) 50 MG tablet Take 1 tablet (50 mg total) by mouth daily.  ? cyclobenzaprine (FLEXERIL) 5 MG tablet Take 1-2 tablets (5-10 mg total) by mouth every evening. As needed for muscle aches (Patient not taking: Reported on 10/14/2021)  ? estradiol (ESTRACE) 1 MG tablet Take 1 tablet (1 mg total) by mouth daily. (Patient not taking: Reported on 10/14/2021)  ? ondansetron (ZOFRAN) 4 MG tablet Take 1 tablet (4 mg total) by mouth every 8 (eight) hours as needed for nausea or vomiting. (Patient not taking: Reported on 10/14/2021)  ? oxyCODONE-acetaminophen (PERCOCET) 10-325 MG tablet Take 1 tablet by mouth every 4 (four) hours as needed for pain.  ? progesterone (PROMETRIUM) 200 MG capsule Take 1 tablet daily for 12 days each month. (Patient not taking: Reported on 10/14/2021)  ? ?No facility-administered medications prior to visit.  ? ? ?Review of Systems  ?Constitutional:  Positive for fever.  ?HENT:  Positive for congestion and sneezing.   ?Respiratory: Negative.    ?Cardiovascular: Negative.   ?Gastrointestinal:  Positive for abdominal pain, constipation and nausea. Negative for vomiting.  ?Genitourinary:  Positive for decreased urine volume, difficulty urinating, hematuria, urgency and vaginal discharge.  ?Musculoskeletal:  Positive for back pain (chronic, takes pain medications).  ? ? ?  Objective  ?  ?BP 124/76 (BP Location: Right Arm, Patient Position: Sitting, Cuff Size: Normal)   Pulse (!) 114   Temp (!) 102.6 ?F (39.2 ?C) (Oral)   Resp 16   Wt 201 lb (91.2 kg)   SpO2 100%   BMI 30.56 kg/m?  ? ? ?Physical Exam ?Vitals and nursing note reviewed.  ?Constitutional:   ?   Appearance: Normal appearance. She is obese. She is ill-appearing.  ?HENT:  ?   Head: Normocephalic  and atraumatic.  ?Cardiovascular:  ?   Rate and Rhythm: Regular rhythm. Tachycardia present.  ?   Pulses: Normal pulses.  ?   Heart sounds: Normal heart sounds.  ?Pulmonary:  ?   Effort: Pulmonary effort is normal.  ?   Breath sounds: Normal breath sounds.  ?Abdominal:  ?   Tenderness: There is abdominal tenderness (RLQ pain, pelvic pain).  ?Neurological:  ?   Mental Status: She is alert.  ?  ? ?No results found for any visits on 10/14/21. ? Assessment & Plan  ?  ? ?UTI symptoms ?X 7 days could be pyelonephritis, cystitis ?BP 124/76, Tachycardia, Fever ?- POCT Urinalysis Dipstick ?- Urine Culture ?- Urinalysis, Routine w reflex microscopic ?- Needs checking for candidiasis and BV and STD if patient agrees. Patient got upset when it was mentioned to her. ?- Needs pregnancy test ? ?2. RLQ abdominal pain ?Could be acute appendicitis or PID, endometritis, salpingitis, TOA ?Paint started from vague abdominal pain to localized RLQ pain, positive for leukocytes in urine, nausea and fever. ?All tests for appendicitis were negative, no guarding or rebound but patient is taking pain medication ?Recommended to proceed to ER for evaluation  ? ?3. Pelvic pain ?Could be PID, Endometritis, salpingitis, TOA ?Hx of "food poisoning" prior to pelvic pain ?Hx of fibroids. See [place] from 2019 ?Patient is perimenopausal with irregular menses ?Has not seen her ObGyn for "sometime" as she is retired. Asked for a referral for ObGyn. ? ?4. Constipation, unspecified constipation type ?X2 days, could be indigestion, IBS ?Previously, was well-controlled on prune juice. ?Might consider laxatives  ? ?5. Gross hematuria ?Patient is not sure if she has menses or blood in her urine.  ?Will refer to urology ? ? FU after ER visit ? ?The patient was advised to call back or seek an in-person evaluation if the symptoms worsen or if the condition fails to improve as anticipated. ? ?I discussed the assessment and treatment plan with the patient. The patient  was provided an opportunity to ask questions and all were answered. The patient agreed with the plan and demonstrated an understanding of the instructions. ? ?The entirety of the information documented in the History of Present Illness, Review of Systems and Physical Exam were personally obtained by me. Portions of this information were initially documented by the CMA and reviewed by me for thoroughness and accuracy.   ?Portions of this note were created using dictation software and may contain typographical errors.  ? ? ?[name], PA-C  ?[place] ?[phone] (phone) ?[phone] (fax) ? ?[facility] ?

## 2021-10-15 DIAGNOSIS — A419 Sepsis, unspecified organism: Secondary | ICD-10-CM | POA: Insufficient documentation

## 2021-10-15 LAB — MICROSCOPIC EXAMINATION
Casts: NONE SEEN /lpf
WBC, UA: >30 /hpf — AB (ref 0–5)

## 2021-10-15 LAB — URINALYSIS, ROUTINE W REFLEX MICROSCOPIC
Bilirubin, UA: NEGATIVE
Glucose, UA: NEGATIVE
Ketones, UA: NEGATIVE
Nitrite, UA: NEGATIVE
Specific Gravity, UA: 1.017 (ref 1.005–1.030)
Urobilinogen, Ur: 0.2 mg/dL (ref 0.2–1.0)
pH, UA: 5.5 (ref 5.0–7.5)

## 2021-10-15 NOTE — Telephone Encounter (Signed)
Oct 14, 2021 ?[name], PA-C ?  ?   7:54 PM ?Unsigned Note ?Message was left to patient regarding paperwork for ER  ?  ? ?

## 2021-10-16 DIAGNOSIS — U071 COVID-19: Secondary | ICD-10-CM | POA: Insufficient documentation

## 2021-10-16 DIAGNOSIS — N10 Acute tubulo-interstitial nephritis: Secondary | ICD-10-CM | POA: Insufficient documentation

## 2021-10-17 ENCOUNTER — Other Ambulatory Visit: Payer: Self-pay | Admitting: Physician Assistant

## 2021-10-17 DIAGNOSIS — R399 Unspecified symptoms and signs involving the genitourinary system: Secondary | ICD-10-CM

## 2021-10-17 LAB — URINE CULTURE

## 2021-10-17 MED ORDER — CEPHALEXIN 500 MG PO CAPS: 500.0000 mg | ORAL_CAPSULE | Freq: Two times a day (BID) | ORAL | 0 refills | Status: DC

## 2021-10-17 NOTE — Progress Notes (Signed)
Your urine culture was positive for Escherichia coli, susceptible to cephalexin. Order is placed for antibiotic. Please, take it for 7 days. If you have any questions feel free to contact me via phone or mychart.  ?Thank you, ?[name], PAC

## 2021-10-17 NOTE — Progress Notes (Signed)
Patient was hospitalized on 10/14/21 with pyelonephritis, a complicated UTI. No call to patient is needed.

## 2021-10-17 NOTE — Addendum Note (Signed)
Addended by: [name] on: 10/17/2021 12:31 PM ? ? Modules accepted: Orders ? ?

## 2021-10-17 NOTE — Progress Notes (Addendum)
Patient received IV antibiotics at [facility] after being hospitalized on 10/14/21 ?

## 2021-10-17 NOTE — Progress Notes (Signed)
Please let patient know that I Rx ed antibiotic and sent to her pharmacy.

## 2021-10-21 ENCOUNTER — Telehealth: Payer: Self-pay

## 2021-10-21 NOTE — Telephone Encounter (Signed)
Transition Care Management Unsuccessful Follow-up Telephone Call ? ?Date of discharge and from where:  [facility] 10-17-21 Dx: COVID ? ?Attempts:  1st Attempt ? ?Reason for unsuccessful TCM follow-up call:  Left voice message ? ?  ?

## 2021-10-22 ENCOUNTER — Encounter: Payer: Self-pay | Admitting: Physician Assistant

## 2021-10-22 ENCOUNTER — Ambulatory Visit (INDEPENDENT_AMBULATORY_CARE_PROVIDER_SITE_OTHER): Payer: No Typology Code available for payment source | Admitting: Physician Assistant

## 2021-10-22 VITALS — BP 138/78 | HR 79 | Temp 98.0°F | Ht 68.0 in | Wt 205.6 lb

## 2021-10-22 DIAGNOSIS — Z87448 Personal history of other diseases of urinary system: Secondary | ICD-10-CM | POA: Diagnosis not present

## 2021-10-22 DIAGNOSIS — E876 Hypokalemia: Secondary | ICD-10-CM | POA: Diagnosis not present

## 2021-10-22 DIAGNOSIS — D649 Anemia, unspecified: Secondary | ICD-10-CM | POA: Insufficient documentation

## 2021-10-22 NOTE — Assessment & Plan Note (Signed)
In hospital, will recheck bmp ?

## 2021-10-22 NOTE — Assessment & Plan Note (Signed)
Advised does not need to take both Keflex and Cipro; d/c keflex and continue with cipro therapy. ?In 2 weeks we will recheck urine ?

## 2021-10-22 NOTE — Assessment & Plan Note (Signed)
In hospital will recheck cbc ?

## 2021-10-22 NOTE — Progress Notes (Signed)
?  ? ?I,Sha'taria Tyson,acting as a Education administrator for [employer], PA-C.,have documented all relevant documentation on the behalf of Mikey Kirschner, PA-C,as directed by  Mikey Kirschner, PA-C while in the presence of Mikey Kirschner, PA-C. ? ? ?Established patient visit ? ? ?Patient: Tina Fuller   DOB: 17-Apr-1961   61 y.o. Female  MRN: [id number] ?Visit Date: 10/22/2021 ? ?Today's healthcare provider: Mikey Kirschner, PA-C  ? ?Cc. Hospital f/u ? ?Subjective  ?  ?HPI  ? ?Follow up Hospitalization ? ?Patient was admitted to [facility] on 10/14/21 and discharged on 10/17/21. ?She was treated for COVID 19, pyelonephritis, sepsis. ?Treatment for this included pt to complete a 7 day course with transition to ciprofloxacin upon dc, she can take tylenol or ibuprofen for fever or pain and consider taking cranberry supplement. ?Telephone follow up was done on 10/21/21 ?She reports excellent compliance with treatment. ?She reports this condition is  improving . ? ?Reports that both Keflex and Cipro were available for pick up, so she picked up both and started taking them.  ?----------------------------------------------------------------------------------------- -  ? ?Medications: ?Outpatient Medications Prior to Visit  ?Medication Sig  ? ciprofloxacin (CIPRO) 750 MG tablet Take 750 mg by mouth 2 (two) times daily.  ? fluconazole (DIFLUCAN) 150 MG tablet Take 1 tablet (150 mg total) by mouth daily.  ? losartan (COZAAR) 50 MG tablet Take 1 tablet (50 mg total) by mouth daily. PLEASE SCHEDULE OFFICE VISIT FOR FURTHER REFILLS. THANK YOU!  ? Naproxen Sodium (ALEVE PO) Take by mouth. PRN  ? NON FORMULARY Vita Fusion Takes 1 tablet qd.  ? oxyCODONE-acetaminophen (PERCOCET) 10-325 MG tablet Take 1 tablet by mouth every 4 (four) hours as needed for pain.  ? phentermine 15 MG capsule Take 1 capsule (15 mg total) by mouth every morning.  ? topiramate (TOPAMAX) 50 MG tablet Take 1 tablet (50 mg total) by mouth daily.   ? ?No facility-administered medications prior to visit.  ? ? ?Review of Systems  ?Constitutional:  Negative for fatigue and fever.  ?Respiratory:  Negative for cough and shortness of breath.   ?Cardiovascular:  Negative for chest pain and leg swelling.  ?Gastrointestinal:  Negative for abdominal pain.  ?Neurological:  Negative for dizziness and headaches.  ? ? ?  Objective  ?  ?Blood pressure 138/78, pulse 79, temperature 98 ?F (36.7 ?C), temperature source Oral, height '5\' 8"'$  (1.727 m), weight 205 lb 9.6 oz (93.3 kg), SpO2 100 %.  ? ?Physical Exam ?Constitutional:   ?   General: She is awake.  ?   Appearance: She is well-developed.  ?HENT:  ?   Head: Normocephalic.  ?Eyes:  ?   Conjunctiva/sclera: Conjunctivae normal.  ?Cardiovascular:  ?   Rate and Rhythm: Normal rate and regular rhythm.  ?   Heart sounds: Normal heart sounds.  ?Pulmonary:  ?   Effort: Pulmonary effort is normal.  ?   Breath sounds: Normal breath sounds.  ?Abdominal:  ?   Tenderness: There is no right CVA tenderness or left CVA tenderness.  ?Skin: ?   General: Skin is warm.  ?Neurological:  ?   Mental Status: She is alert and oriented to person, place, and time.  ?Psychiatric:     ?   Attention and Perception: Attention normal.     ?   Mood and Affect: Mood normal.     ?   Speech: Speech normal.     ?   Behavior: Behavior is cooperative.  ?  ? ?No  results found for any visits on 10/22/21. ? Assessment & Plan  ?  ? ?Problem List Items Addressed This Visit   ? ?  ? Other  ? History of pyelonephritis - Primary  ?  Advised does not need to take both Keflex and Cipro; d/c keflex and continue with cipro therapy. ?In 2 weeks we will recheck urine ? ?  ?  ? Relevant Orders  ? Urine Culture  ? Anemia  ?  In hospital will recheck cbc ? ?  ?  ? Relevant Orders  ? CBC  ? Hypokalemia  ?  In hospital, will recheck bmp ? ?  ?  ? Relevant Orders  ? Basic Metabolic Panel (BMET)  ?  ? ?I, Mikey Kirschner, PA-C have reviewed all documentation for this visit. The  documentation on  10/22/2021  for the exam, diagnosis, procedures, and orders are all accurate and complete. ? ?Mikey Kirschner, PA-C ?[place] ?[id number] ?[place], [place], [address] ?Office: [phone] ?Fax: [phone]  ? ?[facility] ? ?

## 2021-10-23 LAB — BASIC METABOLIC PANEL
BUN/Creatinine Ratio: 13 (ref 9–23)
BUN: 9 mg/dL (ref 6–24)
CO2: 21 mmol/L (ref 20–29)
Calcium: 10.1 mg/dL (ref 8.7–10.2)
Chloride: 104 mmol/L (ref 96–106)
Creatinine, Ser: 0.72 mg/dL (ref 0.57–1.00)
Glucose: 109 mg/dL — ABNORMAL HIGH (ref 70–99)
Potassium: 4.4 mmol/L (ref 3.5–5.2)
Sodium: 139 mmol/L (ref 134–144)
eGFR: 97 mL/min/{1.73_m2} (ref >59)

## 2021-10-23 LAB — CBC
Hematocrit: 35.1 % (ref 34.0–46.6)
Hemoglobin: 11.9 g/dL (ref 11.1–15.9)
MCH: 30.7 pg (ref 26.6–33.0)
MCHC: 33.9 g/dL (ref 31.5–35.7)
MCV: 91 fL (ref 79–97)
Platelets: 480 x10E3/uL — ABNORMAL HIGH (ref 150–450)
RBC: 3.87 x10E6/uL (ref 3.77–5.28)
RDW: 12.9 % (ref 11.7–15.4)
WBC: 8.4 x10E3/uL (ref 3.4–10.8)

## 2021-10-25 ENCOUNTER — Encounter: Payer: Self-pay | Admitting: Physician Assistant

## 2021-10-25 ENCOUNTER — Ambulatory Visit (INDEPENDENT_AMBULATORY_CARE_PROVIDER_SITE_OTHER): Payer: No Typology Code available for payment source | Admitting: Physician Assistant

## 2021-10-25 ENCOUNTER — Ambulatory Visit: Payer: Self-pay | Admitting: *Deleted

## 2021-10-25 VITALS — BP 148/72 | HR 96 | Temp 98.6°F | Resp 16 | Ht 68.0 in | Wt 210.1 lb

## 2021-10-25 DIAGNOSIS — R600 Localized edema: Secondary | ICD-10-CM

## 2021-10-25 NOTE — Telephone Encounter (Signed)
?  Chief Complaint: bilateral leg swelling up to knees.  Right greater than left. ?Symptoms: No pain but legs are tight feeling, hard to get shoes on. ?Frequency: First time this has happened. ?Pertinent Negatives: Patient denies shortness of breath or chest pain or pain in legs. ?Disposition: '[]'$ ED /'[]'$ Urgent Care (no appt availability in office) / '[x]'$ Appointment(In office/virtual)/ '[]'$  [facility]/ '[]'$ Home Care/ '[]'$ Refused Recommended Disposition /'[]'$ [facility]/ '[]'$  Follow-up with PCP ?Additional Notes: Appt made for today with Talitha Givens, PA at [facility] for 2:20.    ?

## 2021-10-25 NOTE — Progress Notes (Signed)
?  ? ? ?Established patient visit ? ? ?Patient: Tina Fuller   DOB: 1960-10-05   61 y.o. Female  MRN: [id number] ?Visit Date: 10/25/2021 ? ?Today's healthcare provider: Dani Gobble Dvon Jiles, PA-C  ?Introduced myself to the patient as a Journalist, newspaper and provided education on APPs in clinical practice.  ? ? ? ?I,Tiffany J Bragg,acting as a Education administrator for [employer], PA-C.,have documented all relevant documentation on the behalf of Paizleigh Wilds E Chirsty Armistead, PA-C,as directed by  [employer], PA-C while in the presence of Syris Brookens E Ean Gettel, PA-C.  ? ?Chief Complaint  ?Patient presents with  ? Edema  ?  Patient complains of bilateral lower leg edema starting 4 days ago. States she was in the hospital last week for kidney infection and on antibiotics.  ? ?Subjective  ?  ?HPI ?HPI   ? ? Edema   ? Additional comments: Patient complains of bilateral lower leg edema starting 4 days ago. States she was in the hospital last week for kidney infection and on antibiotics. ? ?  ?  ?Last edited by Smitty Knudsen, CMA on 10/25/2021  2:44 PM.  ?  ?  ?States her legs are swollen and feel tight ?Reports she had to leave work today due to swelling ?Reports mild tight-type pain  ?Is unsure of cause ?Reports she finished her abx from hospital today ? ?States legs started swelling while she was in the hospital but it became worse when she got home ? ?Reports small dry patch on lower back after hospital - states she put neosporin  on it and alcohol to help with itching - it has has been improving and is not as bothersome  ?Reports mild improvement with elevating legs at the end of the day  ?Reports during the hospital stay she was mostly sedentary - states she was given shots in her stomach to prevent blood clots while in hospital  ?Reports she is urinating normally  ?Reports family history of blood clots and poor circulation so she is a bit concerned about this happening  ? ? ?Medications: ?Outpatient Medications Prior to Visit  ?Medication Sig  ? losartan (COZAAR)  50 MG tablet Take 1 tablet (50 mg total) by mouth daily. PLEASE SCHEDULE OFFICE VISIT FOR FURTHER REFILLS. THANK YOU!  ? Naproxen Sodium (ALEVE PO) Take by mouth. PRN  ? NON FORMULARY Vita Fusion Takes 1 tablet qd.  ? phentermine 15 MG capsule Take 1 capsule (15 mg total) by mouth every morning.  ? topiramate (TOPAMAX) 50 MG tablet Take 1 tablet (50 mg total) by mouth daily.  ? [DISCONTINUED] ciprofloxacin (CIPRO) 750 MG tablet Take 750 mg by mouth 2 (two) times daily. (Patient not taking: Reported on 10/25/2021)  ? [DISCONTINUED] fluconazole (DIFLUCAN) 150 MG tablet Take 1 tablet (150 mg total) by mouth daily. (Patient not taking: Reported on 10/25/2021)  ? [DISCONTINUED] oxyCODONE-acetaminophen (PERCOCET) 10-325 MG tablet Take 1 tablet by mouth every 4 (four) hours as needed for pain. (Patient not taking: Reported on 10/25/2021)  ? ?No facility-administered medications prior to visit.  ? ? ?Review of Systems  ?Constitutional:  Negative for fatigue.  ?Respiratory:  Negative for cough, chest tightness, shortness of breath and wheezing.   ?Cardiovascular:  Positive for leg swelling. Negative for chest pain.  ?Neurological:  Positive for light-headedness. Negative for dizziness and headaches.  ? ? ?  Objective  ?  ?BP (!) 148/72 (BP Location: Right Arm, Patient Position: Sitting, Cuff Size: Normal)   Pulse 96  Temp 98.6 ?F (37 ?C) (Oral)   Resp 16   Ht '5\' 8"'$  (1.727 m)   Wt 210 lb 1.6 oz (95.3 kg)   SpO2 96%   BMI 31.95 kg/m?  ? ? ?Physical Exam ?Vitals reviewed.  ?Constitutional:   ?   General: She is awake.  ?   Appearance: Normal appearance. She is well-developed and well-groomed. She is obese.  ?HENT:  ?   Head: Normocephalic and atraumatic.  ?Cardiovascular:  ?   Rate and Rhythm: Normal rate and regular rhythm.  ?   Pulses: Normal pulses.     ?     Radial pulses are 2+ on the right side and 2+ on the left side.  ?     Dorsalis pedis pulses are 2+ on the right side and 2+ on the left side.  ?   Heart sounds:  Normal heart sounds. No murmur heard. ?  No friction rub. No gallop.  ?Pulmonary:  ?   Effort: Pulmonary effort is normal.  ?   Breath sounds: Normal breath sounds and air entry. No decreased breath sounds, wheezing, rhonchi or rales.  ?Musculoskeletal:  ?   Right lower leg: Edema present.  ?   Left lower leg: Edema present.  ?Skin: ?   General: Skin is warm and moist.  ?   Coloration: Skin is not ashen or pale.  ?Neurological:  ?   Mental Status: She is alert.  ?Psychiatric:     ?   Attention and Perception: Attention normal.     ?   Mood and Affect: Mood and affect normal.     ?   Behavior: Behavior normal. Behavior is cooperative.  ?  ? ? ?No results found for any visits on 10/25/21. ? Assessment & Plan  ?  ? ?Problem List Items Addressed This Visit   ?None ?Visit Diagnoses   ? ? Bilateral leg edema    -  Primary ?Acute, new problem ?Reports bilateral leg swelling for the past week following hospitalization for bacteremia secondary to pyelonephritis and COVID ?She was anticoagulated in hospital and previous hospital follow visit did not note edema ?PE reassuring for cardiac (no murmurs, rubs, gallops) and pulses intact at wrists and DP ?Recommend she try to elevate legs above the level of the heart and use compression stockings to try to improve mild edema ?Discussed keeping skin barrier intact with proper skin care  ?Reviewed return precautions with patient  ?  ? ?  ? ? ? ?No follow-ups on file. ? ? ?I, Oshea Percival E Sharesa Kemp, PA-C, have reviewed all documentation for this visit. The documentation on 10/25/21 for the exam, diagnosis, procedures, and orders are all accurate and complete. ? ? ?Nollie Shiflett, MHS, PA-C ?[facility] ?[facility]  ? ?

## 2021-10-25 NOTE — Telephone Encounter (Signed)
I returned pt's call.   Legs and ankles are swollen and tight.   Having a hard time getting shoes on.   Declined to make an appt when speaking with agent earlier. ? ? ?Reason for Disposition ? [1] MODERATE leg swelling (e.g., swelling extends up to knees) AND [2] new-onset or worsening ? ?Answer Assessment - Initial Assessment Questions ?1. ONSET: "When did the swelling start?" (e.g., minutes, hours, days) ?    For a few days my legs and ankles are swollen.   My right one is more swelling.  It's hard to get my shoe on my right foot it's so swollen. ?I had surgery on my foot first of the year.   ?2. LOCATION: "What part of the leg is swollen?"  "Are both legs swollen or just one leg?" ?    Ankles and legs  ?3. SEVERITY: "How bad is the swelling?" (e.g., localized; mild, moderate, severe) ? - Localized - small area of swelling localized to one leg ? - MILD pedal edema - swelling limited to foot and ankle, pitting edema < 1/4 inch (6 mm) deep, rest and elevation eliminate most or all swelling ? - MODERATE edema - swelling of lower leg to knee, pitting edema > 1/4 inch (6 mm) deep, rest and elevation only partially reduce swelling ? - SEVERE edema - swelling extends above knee, facial or hand swelling present  ?    Swelling up to my knees and tight feeling. ?4. REDNESS: "Does the swelling look red or infected?" ?    *No Answer* ?5. PAIN: "Is the swelling painful to touch?" If Yes, ask: "How painful is it?"   (Scale 1-10; mild, moderate or severe) ?    No pain ?6. FEVER: "Do you have a fever?" If Yes, ask: "What is it, how was it measured, and when did it start?"  ?    *No Answer* ?7. CAUSE: "What do you think is causing the leg swelling?" ?    *No Answer* ?8. MEDICAL HISTORY: "Do you have a history of heart failure, kidney disease, liver failure, or cancer?" ?    *No Answer* ?9. RECURRENT SYMPTOM: "Have you had leg swelling before?" If Yes, ask: "When was the last time?" "What happened that time?" ?    *No Answer* ?10.  OTHER SYMPTOMS: "Do you have any other symptoms?" (e.g., chest pain, difficulty breathing) ?      *No Answer* ?11. PREGNANCY: "Is there any chance you are pregnant?" "When was your last menstrual period?" ?      *No Answer* ? ?Protocols used: Leg Swelling and Edema-A-AH ? ?

## 2021-10-25 NOTE — Patient Instructions (Addendum)
For the swelling in you legs I recommend that you do the following: ? ?Elevate your legs above the level of your heart when you are able to ?Use compression stockings with compression measure of 15-20 mmHg - I recommend getting these from a medical supply store to make sure you are getting the right compression measure.  ?Put these on in the morning as you are getting out of bed to make it easier and leave on for the day ?Keep the skin of your legs and feet, clean, dry and moisturized to prevent skin injury  ? ?If the swelling continues or seems like it is getting worse please let us know so we can evaluate further  ? ? ?

## 2021-10-29 ENCOUNTER — Other Ambulatory Visit: Payer: Self-pay | Admitting: Family Medicine

## 2021-10-29 DIAGNOSIS — Z6829 Body mass index (BMI) 29.0-29.9, adult: Secondary | ICD-10-CM

## 2021-10-29 NOTE — Telephone Encounter (Signed)
Medication Refill - Medication: phentermine 15 MG capsule ? ?Has the patient contacted their pharmacy? Yes.   Pt told to contact provider ? ?Preferred Pharmacy (with phone number or street name):  ?[facility], [place] Phone:  [phone]  ?Fax:  [phone]  ?  ? ?Has the patient been seen for an appointment in the last year OR does the patient have an upcoming appointment? Yes.   ? ?Agent: Please be advised that RX refills may take up to 3 business days. We ask that you follow-up with your pharmacy. ? ?

## 2021-10-30 MED ORDER — PHENTERMINE HCL 15 MG PO CAPS: 15.0000 mg | ORAL_CAPSULE | Freq: Every morning | ORAL | 3 refills | Status: DC

## 2021-10-30 NOTE — Telephone Encounter (Signed)
Requested medications are due for refill today.  yes ? ?Requested medications are on the active medications list.  yes ? ?Last refill. 04/23/2021 #30 3 refills ? ?Future visit scheduled.   yes ? ?Notes to clinic.  Medication refill is not delegated. ? ? ? ?Requested Prescriptions  ?Pending Prescriptions Disp Refills  ? phentermine 15 MG capsule 30 capsule 3  ?  Sig: Take 1 capsule (15 mg total) by mouth every morning.  ?  ? Not Delegated - Neurology: Anticonvulsants - Controlled - phentermine hydrochloride Failed - 10/29/2021  5:23 PM  ?  ?  Failed - This refill cannot be delegated  ?  ?  Failed - Last BP in normal range  ?  BP Readings from Last 1 Encounters:  ?10/25/21 (!) 148/72  ?   ?  ?  Passed - eGFR in normal range and within 360 days  ?  GFR calc Af Amer  ?Date Value Ref Range Status  ?12/28/2019 110 >59 mL/min/1.73 Final  ?  Comment:  ?  **[facility] currently reports eGFR in compliance with the current** ?  recommendations of the [employer]. [facility] will ?  update reporting as new guidelines are published from the NKF-ASN ?  Task force. ?  ? ?GFR calc non Af Amer  ?Date Value Ref Range Status  ?12/28/2019 96 >59 mL/min/1.73 Final  ? ?eGFR  ?Date Value Ref Range Status  ?10/22/2021 97 >59 mL/min/1.73 Final  ?   ?  ?  Passed - Cr in normal range and within 360 days  ?  Creatinine, Ser  ?Date Value Ref Range Status  ?10/22/2021 0.72 0.57 - 1.00 mg/dL Final  ?   ?  ?  Passed - Valid encounter within last 6 months  ?  Recent Outpatient Visits   ? ?      ? 5 days ago Bilateral leg edema  ? [place], PA-C  ? 1 week ago History of pyelonephritis  ? [facility] Thedore Mins, [place], PA-C  ? 2 weeks ago Urinary tract infection symptoms  ? [facility] [place], [place], PA-C  ? 6 months ago Encounter for screening mammogram for malignant neoplasm of breast  ? [facility] Birdie Sons, MD  ? 10 months ago Frequent headaches  ?  [facility] Birdie Sons, MD  ? ?  ?  ? ? ?  ?  ?  Passed - Weight completed in the last 3 months  ?  Wt Readings from Last 1 Encounters:  ?10/25/21 210 lb 1.6 oz (95.3 kg)  ? ?  ?  ?  ?  ?

## 2021-11-12 ENCOUNTER — Ambulatory Visit (INDEPENDENT_AMBULATORY_CARE_PROVIDER_SITE_OTHER): Payer: No Typology Code available for payment source | Admitting: Physician Assistant

## 2021-11-12 DIAGNOSIS — Z87448 Personal history of other diseases of urinary system: Secondary | ICD-10-CM | POA: Diagnosis not present

## 2021-11-12 LAB — POCT URINALYSIS DIPSTICK
Bilirubin, UA: NEGATIVE
Glucose, UA: NEGATIVE
Ketones, UA: NEGATIVE
Leukocytes, UA: NEGATIVE
Nitrite, UA: NEGATIVE
Protein, UA: NEGATIVE
Spec Grav, UA: 1.015 (ref 1.010–1.025)
Urobilinogen, UA: 0.2 E.U./dL
pH, UA: 6 (ref 5.0–8.0)

## 2021-11-12 NOTE — Progress Notes (Signed)
    I,Sha'taria Tyson,acting as a Education administrator for [employer], PA-C.,have documented all relevant documentation on the behalf of Mikey Kirschner, PA-C,as directed by  Mikey Kirschner, PA-C while in the presence of Mikey Kirschner, PA-C.  Acute Office Visit  Subjective:     Patient ID: Tina Fuller, female    DOB: 03/19/1961, 61 y.o.   MRN: [id number]  Cc. F/u pyelonephritis    HPI Patient is in today for urine recheck after hospital follow-up on 10/22/21. Patient was treated for pyelonephritis while at [place] 10/14/21-10/17/21 and given 7 day course of ciprofloxacin to compete. POCT urine dip completed. Negative urine and no culture sent per [place] Takao Lizer, PA-C Pt denies symptoms at this time.   Objective:    There were no vitals taken for this visit.  Results for orders placed or performed in visit on 11/12/21  POCT Urinalysis Dipstick  Result Value Ref Range   Color, UA yellow    Clarity, UA clear    Glucose, UA Negative Negative   Bilirubin, UA neg    Ketones, UA neg    Spec Grav, UA 1.015 1.010 - 1.025   Blood, UA trace    pH, UA 6.0 5.0 - 8.0   Protein, UA Negative Negative   Urobilinogen, UA 0.2 0.2 or 1.0 E.U./dL   Nitrite, UA neg    Leukocytes, UA Negative Negative   Appearance     Odor          Assessment & Plan:   Problem List Items Addressed This Visit       Other   History of pyelonephritis - Primary    Asymptomatic, UA clear.  No need for culture.        Relevant Orders   POCT Urinalysis Dipstick (Completed)    I, Mikey Kirschner, PA-C have reviewed all documentation for this visit. The documentation on  11/12/2021 for the exam, diagnosis, procedures, and orders are all accurate and complete.  Mikey Kirschner, PA-C [facility] [address] [place], [place], [address] Office: [phone] Fax: [phone]

## 2021-11-12 NOTE — Assessment & Plan Note (Signed)
Asymptomatic, UA clear.  No need for culture.

## 2022-01-20 ENCOUNTER — Telehealth: Payer: Self-pay

## 2022-01-20 NOTE — Telephone Encounter (Signed)
Mychart message sent.

## 2022-01-20 NOTE — Telephone Encounter (Signed)
Copied from [place] [id number]. Topic: Medicare AWV >> Jan 20, 2022 10:28 AM Tina Fuller wrote: Patient would like to schedule a wellness exam before September 1st. Please advise patient.

## 2022-02-05 ENCOUNTER — Other Ambulatory Visit: Payer: Self-pay | Admitting: Family Medicine

## 2022-02-05 DIAGNOSIS — Z6829 Body mass index (BMI) 29.0-29.9, adult: Secondary | ICD-10-CM

## 2022-03-13 DIAGNOSIS — Z1239 Encounter for other screening for malignant neoplasm of breast: Secondary | ICD-10-CM

## 2022-05-05 ENCOUNTER — Encounter: Payer: Self-pay | Admitting: Physician Assistant

## 2022-05-05 ENCOUNTER — Ambulatory Visit (INDEPENDENT_AMBULATORY_CARE_PROVIDER_SITE_OTHER): Payer: No Typology Code available for payment source | Admitting: Physician Assistant

## 2022-05-05 VITALS — BP 139/77 | HR 70 | Temp 98.1°F | Resp 16 | Wt 210.8 lb

## 2022-05-05 DIAGNOSIS — M25552 Pain in left hip: Secondary | ICD-10-CM | POA: Diagnosis not present

## 2022-05-05 DIAGNOSIS — Z6829 Body mass index (BMI) 29.0-29.9, adult: Secondary | ICD-10-CM

## 2022-05-05 DIAGNOSIS — M542 Cervicalgia: Secondary | ICD-10-CM

## 2022-05-05 DIAGNOSIS — M5440 Lumbago with sciatica, unspecified side: Secondary | ICD-10-CM | POA: Diagnosis not present

## 2022-05-05 MED ORDER — CYCLOBENZAPRINE HCL 5 MG PO TABS: 5.0000 mg | ORAL_TABLET | Freq: Three times a day (TID) | ORAL | 0 refills | Status: DC | PRN

## 2022-05-05 NOTE — Progress Notes (Signed)
I,Sulibeya S Dimas,acting as a Education administrator for [employer], PA-C.,have documented all relevant documentation on the behalf of Tina Speak, PA-C,as directed by  [employer], PA-C while in the presence of [employer], PA-C.     Established patient visit   Patient: Tina Fuller   DOB: 28-Jul-1960   61 y.o. Female  MRN: [id number] Visit Date: 05/05/2022  Today's healthcare provider: [name], PA-C   Chief Complaint  Patient presents with   Hip Pain   Subjective    Hip Pain  There was no injury mechanism. The pain is present in the left leg and left hip. The quality of the pain is described as aching. The pain is moderate (severe at night). The pain has been Worsening since onset. Associated symptoms include muscle weakness. Pertinent negatives include no inability to bear weight, loss of motion, loss of sensation, numbness or tingling. The symptoms are aggravated by weight bearing (standing at work). She has tried elevation, ice, acetaminophen, heat and NSAIDs (lidocain patch) for the symptoms. The treatment provided moderate relief.  She reports she has been treated for hip bursitis in the past.   Medications: Outpatient Medications Prior to Visit  Medication Sig   losartan (COZAAR) 50 MG tablet Take 1 tablet (50 mg total) by mouth daily. PLEASE SCHEDULE OFFICE VISIT FOR FURTHER REFILLS. THANK YOU!   Naproxen Sodium (ALEVE PO) Take by mouth. PRN   NON FORMULARY Vita Fusion Takes 1 tablet qd.   phentermine 15 MG capsule Take 1 capsule (15 mg total) by mouth every morning.   topiramate (TOPAMAX) 50 MG tablet Take 1 tablet by mouth once daily   No facility-administered medications prior to visit.    Review of Systems  Constitutional:  Negative for chills and fever.  Respiratory:  Negative for cough, chest tightness and shortness of breath.   Cardiovascular:  Positive for leg swelling.  Gastrointestinal:  Negative for abdominal pain, diarrhea, nausea and vomiting.   Musculoskeletal:  Positive for arthralgias and myalgias.  Neurological:  Negative for tingling and numbness.    {Labs  Heme  Chem  Endocrine  Serology  Results Review (optional):23779}   Objective    BP 139/77 (BP Location: Right Arm, Patient Position: Sitting, Cuff Size: Large)   Pulse 70   Temp 98.1 F (36.7 C)   Resp 16   Wt 210 lb 12.8 oz (95.6 kg)   BMI 32.05 kg/m  BP Readings from Last 3 Encounters:  05/05/22 139/77  10/25/21 (!) 148/72  10/22/21 138/78   Wt Readings from Last 3 Encounters:  05/05/22 210 lb 12.8 oz (95.6 kg)  10/25/21 210 lb 1.6 oz (95.3 kg)  10/22/21 205 lb 9.6 oz (93.3 kg)      Physical Exam Constitutional:      General: She is not in acute distress.    Appearance: Normal appearance.  HENT:     Head: Normocephalic.  Eyes:     Extraocular Movements: Extraocular movements intact.     Pupils: Pupils are equal, round, and reactive to light.  Neck:     Vascular: No carotid bruit.  Pulmonary:     Effort: Pulmonary effort is normal. No respiratory distress.  Musculoskeletal:        General: Tenderness (low back, left hip with radiation to the posteriolateral surface of the left leg) present. No swelling, deformity or signs of injury.     Cervical back: Tenderness (left side/neck with movements to opposite side) present. No rigidity.     Right  lower leg: No edema.     Left lower leg: No edema.     Comments: Straight raised leg tests and crossed straight leg tests - negative Left Faber test positive with negative Fadir test on the left.   Lymphadenopathy:     Cervical: No cervical adenopathy.  Skin:    General: Skin is warm.     Capillary Refill: Capillary refill takes less than 2 seconds.     Findings: No bruising, erythema, lesion or rash.  Neurological:     General: No focal deficit present.     Mental Status: She is alert and oriented to person, place, and time. Mental status is at baseline.     Cranial Nerves: No cranial nerve  deficit.     Sensory: No sensory deficit.     Motor: No weakness.     Coordination: Coordination normal.     Gait: Gait abnormal.     Deep Tendon Reflexes: Reflexes normal.  Psychiatric:        Behavior: Behavior normal.        Thought Content: Thought content normal.        Judgment: Judgment normal.      No results found for any visits on 05/05/22.  Assessment & Plan     1. Obesity Chronic and worsening Her current BMI 32.05. gained 5 pounds since May of 2023 In the past, was prescribed phentermine and topiramate but did not take them? Topiramate was last dispensed on 10/29/21/#30 Phentermine was last dispensed on 04/23/22/#30 Advised to try low-carb, low-cholesterol diet and daily exercise as tolerated FU with PCP  2. Pain of left hip X 3 mo Chronic, worsening? No hx of injury or overuse Could be hip impingement vs SIJ of the lower back vs lumbar radiculopathy In the past, did XR but does not remember when - DG Lumbar Spine Complete; Future - DG Hip Unilat W OR W/O Pelvis 2-3 Views Left; Future - cyclobenzaprine (FLEXERIL) 5 MG tablet; Take 1 tablet (5 mg total) by mouth 3 (three) times daily as needed for muscle spasms.  Dispense: 30 tablet; Refill: 0 Continue tylenol with ibuprofen/alternating Try OTC antiinflammatory Voltaren and tiger balm Advised to proceed with PT but pt cannot due to financial problems Advised to try chiropractic and stretching exercise at home.  3. Low back pain with sciatica, sciatica laterality unspecified, unspecified back pain laterality, unspecified chronicity Chronic and worsening Could be due to OA Pt advised to proceed with PT, see above - DG Lumbar Spine Complete; Future - DG Hip Unilat W OR W/O Pelvis 2-3 Views Left; Future - cyclobenzaprine (FLEXERIL) 5 MG tablet; Take 1 tablet (5 mg total) by mouth 3 (three) times daily as needed for muscle spasms.  Dispense: 30 tablet; Refill: 0 Will reassess in a mo  4. Neck pain on left  side Chronic and worsening? Per chart review, DG cervical spine showed  1. Multilevel degenerative disc disease and facet hypertrophy in the cervical spine. 2. Trace anterolisthesis of C4 on C5 and retrolisthesis of C5 on C6, likely facet mediated. Advised to proceed with PT or with steroid shot but pt declined at this visit Continue alternate tylenol with NSAIDs OTC, heat and exercise at home, self-massage. Will revisit at the FU  No follow-ups on file.     The patient was advised to call back or seek an in-person evaluation if the symptoms worsen or if the condition fails to improve as anticipated.  I discussed the assessment and treatment plan with  the patient. The patient was provided an opportunity to ask questions and all were answered. The patient agreed with the plan and demonstrated an understanding of the instructions.  The entirety of the information documented in the History of Present Illness, Review of Systems and Physical Exam were personally obtained by me. Portions of this information were initially documented by the CMA and reviewed by me for thoroughness and accuracy.     Tina Fuller, [facility], [place] [phone] (phone) [phone] (fax)

## 2022-05-06 ENCOUNTER — Telehealth: Payer: Self-pay | Admitting: Physician Assistant

## 2022-05-06 ENCOUNTER — Other Ambulatory Visit: Payer: Self-pay | Admitting: Family Medicine

## 2022-05-06 DIAGNOSIS — Z6829 Body mass index (BMI) 29.0-29.9, adult: Secondary | ICD-10-CM

## 2022-05-06 MED ORDER — TOPIRAMATE 50 MG PO TABS: 50.0000 mg | ORAL_TABLET | Freq: Every day | ORAL | 0 refills | Status: DC

## 2022-05-06 NOTE — Progress Notes (Signed)
Patient request refill topiramate at recent O.V. with [name]. Rf sent with instructions to follow up with me.

## 2022-05-14 ENCOUNTER — Other Ambulatory Visit: Payer: Self-pay | Admitting: Physician Assistant

## 2022-05-14 DIAGNOSIS — M25552 Pain in left hip: Secondary | ICD-10-CM

## 2022-05-14 DIAGNOSIS — M5440 Lumbago with sciatica, unspecified side: Secondary | ICD-10-CM

## 2022-05-14 MED ORDER — CYCLOBENZAPRINE HCL 5 MG PO TABS: ORAL_TABLET | ORAL | 0 refills | Status: DC

## 2022-05-14 NOTE — Telephone Encounter (Signed)
Pt calling back to follow up on refill  cyclobenzaprine (FLEXERIL) 5 MG tablet  Pharmacy unable to fill due to directions.   Take 1 tablet (5 mg total) by mouth 3 (three) times daily as needed for muscle spasms. - Oral   Sent to pharmacy as: cyclobenzaprine (FLEXERIL) 5 MG tablet   Notes to Pharmacy: 1 tablet by mouth daily at bedtime    Pt states she needs her medication.  Please advise  [place] [address] [place]

## 2022-05-22 ENCOUNTER — Other Ambulatory Visit: Payer: Self-pay | Admitting: Cardiovascular Disease

## 2022-05-22 NOTE — Telephone Encounter (Signed)
Please contact pt for future appointment. Pt due for f/u pt needing refills.

## 2022-05-23 ENCOUNTER — Telehealth: Payer: Self-pay | Admitting: Cardiovascular Disease

## 2022-05-23 MED ORDER — LOSARTAN POTASSIUM 50 MG PO TABS: 50.0000 mg | ORAL_TABLET | Freq: Every day | ORAL | 0 refills | Status: DC

## 2022-05-23 NOTE — Telephone Encounter (Signed)
*  STAT* If patient is at the pharmacy, call can be transferred to refill team.   1. Which medications need to be refilled? (please list name of each medication and dose if known)   losartan (COZAAR) 50 MG tablet   2. Which pharmacy/location (including street and city if local pharmacy) is medication to be sent to?  [place], [place]   3. Do they need a 30 day or 90 day supply? 90 day  Patient stated she is completely out of this medication and has been out for 2 days. Patient has appointment on 12/19.

## 2022-05-23 NOTE — Telephone Encounter (Signed)
LVM to schedule appt, please schedule 

## 2022-06-02 NOTE — Progress Notes (Unsigned)
Cardiology Office Note  Date:  06/03/2022   ID:  Tina Fuller, Tina Fuller 04/04/1961, MRN [id number]  PCP:  Birdie Sons, MD   Chief Complaint  Patient presents with   Other    12 month f/u C/o left leg pain worse when resting and occasional edema. Meds reviewed verbally with pt.     HPI:  61 year old woman with  obesity,   smoke but stopped approximately 15 years ago. no known cardiac history, family history of diabetes and heart disease  seen in 2011 for tachycardia, chest tightness.  She presents for routine followup of her blood pressure, risk factors tachycardia, leg pain, arm pain  Last seen by myself in clinic November 2022  Still with leg pain, cramps Calf pain On feet all day at [place] Prior workup for similar symptoms, ABIs negative for PAD in the past  working on weight loss with assistance of her PCP taking phenteramine and topiramate, restarted in the past several weeks  no regular exercise program  Denies chest pain or shortness of breath concerning for angina Reports blood pressure well-controlled  Lab work reviewed Total chol 231, LDL 138 Previously declined cholesterol medication CT coronary calcium score previously offered  EKG personally reviewed by myself on todays visit Shows normal sinus rhythm rate 82 bpm no significant ST or T wave changes   PMH:   has a past medical history of History of chicken pox, Low back pain, Muscle spasms of neck (08/19/2017), Palpitations, Trochanteric bursitis of left hip, Trochanteric bursitis of left hip (05/21/2015), and Tubular adenoma of colon (08/2014).  PSH:    Past Surgical History:  Procedure Laterality Date   BREAST BIOPSY     Benign   COLONOSCOPY     COLONOSCOPY WITH PROPOFOL N/A 04/17/2020   Procedure: COLONOSCOPY WITH PROPOFOL;  Surgeon: [name], MD;  Location: [facility] ENDOSCOPY;  Service: Endoscopy;  Laterality: N/A;   fissurotomy     LIPOMA EXCISION Left    left side    Current  Outpatient Medications  Medication Sig Dispense Refill   losartan (COZAAR) 50 MG tablet Take 1 tablet (50 mg total) by mouth daily. Additional refill available at office visit. 15 tablet 0   Naproxen Sodium (ALEVE PO) Take by mouth. PRN     phentermine 15 MG capsule Take 1 capsule (15 mg total) by mouth every morning. 30 capsule 3   topiramate (TOPAMAX) 50 MG tablet Take 1 tablet (50 mg total) by mouth daily. 30 tablet 0   cyclobenzaprine (FLEXERIL) 5 MG tablet 1 tablet at bedtime (Patient not taking: Reported on 06/03/2022) 30 tablet 0   NON FORMULARY Vita Fusion Takes 1 tablet qd. (Patient not taking: Reported on 06/03/2022)     No current facility-administered medications for this visit.    Allergies:   Bee pollen   Social History:  The patient  reports that she has quit smoking. Her smoking use included cigarettes. She has a 2.00 pack-year smoking history. She has never used smokeless tobacco. She reports current alcohol use. She reports that she does not use drugs.   Family History:   family history includes Alzheimer's disease in her mother; Cerebrovascular Accident in her father and mother; Diabetes in her brother, mother, and sister; Heart disease in her brother, brother, and sister; Heart failure in her father, sister, and sister; Hyperlipidemia in her brother, brother, father, and mother; Hypertension in her brother, brother, father, and mother.    Review of Systems: Review of Systems  Constitutional: Negative.  Respiratory: Negative.    Cardiovascular: Negative.   Gastrointestinal: Negative.   Musculoskeletal:  Positive for myalgias.       Posterior neck tight  Neurological: Negative.   Psychiatric/Behavioral:  Positive for suicidal ideas.   All other systems reviewed and are negative.   PHYSICAL EXAM: VS:  BP 120/70 (BP Location: Left Arm, Patient Position: Sitting, Cuff Size: Large)   Pulse 82   Ht '5\' 9"'$  (1.753 m)   Wt 210 lb 8 oz (95.5 kg)   SpO2 99%   BMI 31.09  kg/m  , BMI Body mass index is 31.09 kg/m. Constitutional:  oriented to person, place, and time. No distress.  HENT:  Head: Grossly normal Eyes:  no discharge. No scleral icterus.  Neck: No JVD, no carotid bruits  Cardiovascular: Regular rate and rhythm, no murmurs appreciated Pulmonary/Chest: Clear to auscultation bilaterally, no wheezes or rails Abdominal: Soft.  no distension.  no tenderness.  Musculoskeletal: Normal range of motion Neurological:  normal muscle tone. Coordination normal. No atrophy Skin: Skin warm and dry Psychiatric: normal affect, pleasant   Recent Labs: 10/22/2021: BUN 9; Creatinine, Ser 0.72; Hemoglobin 11.9; Platelets 480; Potassium 4.4; Sodium 139    Lipid Panel Lab Results  Component Value Date   CHOL 231 (H) 04/24/2021   HDL 82 04/24/2021   LDLCALC 138 (H) 04/24/2021   TRIG 64 04/24/2021      Wt Readings from Last 3 Encounters:  06/03/22 210 lb 8 oz (95.5 kg)  05/05/22 210 lb 12.8 oz (95.6 kg)  10/25/21 210 lb 1.6 oz (95.3 kg)      ASSESSMENT AND PLAN:  Palpitations - Plan: EKG 12-Lead Denies significant symptoms, no further workup at this time  Hyperlipidemia Cholesterol numbers discussed with her, declining cholesterol medication Discussed calcium scoring, she will call us if she would like this ordered  Shortness of breath Recommend walking program, diet modification  Paravertebral muscle spasms Left neck, left posterior shoulder Likely exacerbated by her longstanding with head distended downward at work for long hours  Essential hypertension Blood pressure is well controlled on today's visit. No changes made to the medications.    Total encounter time more than 30 minutes  Greater than 50% was spent in counseling and coordination of care with the patient    No orders of the defined types were placed in this encounter.    Signed, Esmond Plants, M.D., Ph.D. 06/03/2022  [place],  [facility]

## 2022-06-03 ENCOUNTER — Ambulatory Visit
Payer: No Typology Code available for payment source | Attending: Cardiovascular Disease | Admitting: Cardiovascular Disease

## 2022-06-03 ENCOUNTER — Encounter: Payer: Self-pay | Admitting: Cardiovascular Disease

## 2022-06-03 VITALS — BP 120/70 | HR 82 | Ht 69.0 in | Wt 210.5 lb

## 2022-06-03 DIAGNOSIS — R600 Localized edema: Secondary | ICD-10-CM

## 2022-06-03 DIAGNOSIS — R002 Palpitations: Secondary | ICD-10-CM

## 2022-06-03 DIAGNOSIS — E782 Mixed hyperlipidemia: Secondary | ICD-10-CM

## 2022-06-03 DIAGNOSIS — I10 Essential (primary) hypertension: Secondary | ICD-10-CM

## 2022-06-03 MED ORDER — LOSARTAN POTASSIUM 50 MG PO TABS: 50.0000 mg | ORAL_TABLET | Freq: Every day | ORAL | 3 refills | Status: DC

## 2022-06-03 NOTE — Patient Instructions (Addendum)
Medication Instructions:  Your physician recommends that you continue on your current medications as directed. Please refer to the Current Medication list given to you today.  If you need a refill on your cardiac medications before your next appointment, please call your pharmacy.   Lab work: No new labs needed  Testing/Procedures: No new testing needed  Follow-Up: At [facility], you and your health needs are our priority.  As part of our continuing mission to provide you with exceptional heart care, we have created designated Provider Care Teams.  These Care Teams include your primary Cardiologist (physician) and Advanced Practice Providers (APPs -  Physician Assistants and Nurse Practitioners) who all work together to provide you with the care you need, when you need it.  You will need a follow up appointment in 12 months  Providers on your designated Care Team:   Christopher Berge, NP Ryan Dunn, PA-C Cadence Furth, PA-C  COVID-19 Vaccine Information can be found at: [email or web address] For questions related to vaccine distribution or appointments, please email [email or web address] or call [phone].   

## 2022-07-29 ENCOUNTER — Ambulatory Visit
Admission: RE | Admit: 2022-07-29 | Discharge: 2022-07-29 | Disposition: A | Discharge status: Home / Self Care | Payer: No Typology Code available for payment source | Source: Ambulatory Visit | Attending: Family Medicine | Admitting: Family Medicine

## 2022-07-29 ENCOUNTER — Ambulatory Visit: Payer: No Typology Code available for payment source

## 2022-07-29 ENCOUNTER — Ambulatory Visit (INDEPENDENT_AMBULATORY_CARE_PROVIDER_SITE_OTHER): Payer: No Typology Code available for payment source | Admitting: Family Medicine

## 2022-07-29 ENCOUNTER — Ambulatory Visit
Admission: RE | Admit: 2022-07-29 | Discharge: 2022-07-29 | Disposition: A | Discharge status: Home / Self Care | Payer: No Typology Code available for payment source | Attending: Family Medicine | Admitting: Family Medicine

## 2022-07-29 ENCOUNTER — Other Ambulatory Visit (HOSPITAL_COMMUNITY)
Admission: RE | Admit: 2022-07-29 | Discharge: 2022-07-29 | Disposition: A | Discharge status: Home / Self Care | Payer: No Typology Code available for payment source | Source: Ambulatory Visit | Attending: Family Medicine | Admitting: Family Medicine

## 2022-07-29 VITALS — BP 142/82 | HR 70 | Temp 98.4°F | Ht 68.0 in | Wt 211.0 lb

## 2022-07-29 DIAGNOSIS — M5441 Lumbago with sciatica, right side: Secondary | ICD-10-CM

## 2022-07-29 DIAGNOSIS — G8929 Other chronic pain: Secondary | ICD-10-CM | POA: Diagnosis present

## 2022-07-29 DIAGNOSIS — Z0000 Encounter for general adult medical examination without abnormal findings: Secondary | ICD-10-CM | POA: Diagnosis not present

## 2022-07-29 DIAGNOSIS — N951 Menopausal and female climacteric states: Secondary | ICD-10-CM

## 2022-07-29 DIAGNOSIS — E782 Mixed hyperlipidemia: Secondary | ICD-10-CM | POA: Diagnosis not present

## 2022-07-29 DIAGNOSIS — M25551 Pain in right hip: Secondary | ICD-10-CM

## 2022-07-29 DIAGNOSIS — Z6832 Body mass index (BMI) 32.0-32.9, adult: Secondary | ICD-10-CM

## 2022-07-29 DIAGNOSIS — Z124 Encounter for screening for malignant neoplasm of cervix: Secondary | ICD-10-CM | POA: Insufficient documentation

## 2022-07-29 DIAGNOSIS — I10 Essential (primary) hypertension: Secondary | ICD-10-CM | POA: Diagnosis not present

## 2022-07-29 DIAGNOSIS — R232 Flushing: Secondary | ICD-10-CM

## 2022-07-29 MED ORDER — PHENTERMINE HCL 15 MG PO CAPS: 15.0000 mg | ORAL_CAPSULE | Freq: Every morning | ORAL | 3 refills | Status: DC

## 2022-07-29 MED ORDER — TOPIRAMATE 50 MG PO TABS: 50.0000 mg | ORAL_TABLET | Freq: Every day | ORAL | Status: DC

## 2022-07-29 NOTE — Progress Notes (Signed)
Argentina Ponder DeSanto,acting as a scribe for Lelon Huh, MD.,have documented all relevant documentation on the behalf of Lelon Huh, MD,as directed by  Lelon Huh, MD while in the presence of Lelon Huh, MD.    Complete physical exam   Patient: Tina Fuller   DOB: Oct 03, 1960   62 y.o. Female  MRN: [id number] Visit Date: 07/29/2022  Today's healthcare provider: Lelon Huh, MD   Chief Complaint  Patient presents with   Annual Exam   Hypertension   Subjective    Tina Fuller is a 62 y.o. female who presents today for a complete physical exam.  She reports consuming a  healthy  diet. The patient does not participate in regular exercise at present. She generally feels fairly well. She reports sleeping poorly. She does have several additional problems to discuss today.  HPI  Patient also complains of continued hip pain.She saw [name] in November and had xray right hip ordered which were not done. Pain has now migrated to left side associated with low back pain and pain radiating into back of right leg.   Patient is also concerned about menopausal status and would like to discuss possibility of testing to see if she is menopausal. She has frequent hot flashes. She tried ERT a few years ago which she did not tolerate. She states her last period was 6 months ago and has had no had any bleeding at all since then.    Past Medical History:  Diagnosis Date   History of chicken pox    Low back pain    Muscle spasms of neck 08/19/2017   Palpitations    Trochanteric bursitis of left hip    Trochanteric bursitis of left hip 05/21/2015   Tubular adenoma of colon 08/2014   Past Surgical History:  Procedure Laterality Date   BREAST BIOPSY     Benign   COLONOSCOPY     COLONOSCOPY WITH PROPOFOL N/A 04/17/2020   Procedure: COLONOSCOPY WITH PROPOFOL;  Surgeon: [name], MD;  Location: [facility] ENDOSCOPY;  Service: Endoscopy;  Laterality: N/A;   fissurotomy      LIPOMA EXCISION Left    left side   Social History   Socioeconomic History   Marital status: Married    Spouse name: Not on file   Number of children: 2   Years of education: GED   Highest education level: Not on file  Occupational History   Occupation: Microbiologist: [employer]    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  Tobacco Use   Smoking status: Former    Packs/day: 0.25    Years: 8.00    Total pack years: 2.00    Types: Cigarettes   Smokeless tobacco: Never   Tobacco comments:    smoked less than 1/2 ppd for 5-10 years   Vaping Use   Vaping Use: Never used  Substance and Sexual Activity   Alcohol use: Yes    Comment: occasional    Drug use: No   Sexual activity: Not on file  Other Topics Concern   Not on file  Social History Narrative   Divorced; full time; exercises regularly - walking    Social Determinants of Health   Financial Resource Strain: Not on file  Food Insecurity: Not on file  Transportation Needs: Not on file  Physical Activity: Not on file  Stress: Not on file  Social Connections: Not on file  Intimate Partner Violence: Not on file   Family Status  Relation Name Status   Mother  Deceased at age 21       HTN; non-insulin dependent diabetes, alzheimers   Father  Deceased at age 35       heart disease and CA   Sister  Deceased       CAD with a stent at 39    Sister  5   Brother  Alive       CHF and stroke   Brother  Alive       CAD in 57s   Neg Hx  (Not Specified)   Family History  Problem Relation Age of Onset   Hypertension Mother    Hyperlipidemia Mother    Diabetes Mother    Alzheimer's disease Mother    Cerebrovascular Accident Mother    Heart failure Father    Hyperlipidemia Father    Hypertension Father    Cerebrovascular Accident Father    Heart disease Sister    Diabetes Sister    Heart failure Sister    Heart failure Sister    Heart disease Brother    Hypertension Brother    Hyperlipidemia Brother     Diabetes Brother    Heart disease Brother    Hyperlipidemia Brother    Hypertension Brother    Breast cancer Neg Hx    Colon cancer Neg Hx    Allergies  Allergen Reactions   Bee Pollen     Patient Care Team: Birdie Sons, MD as PCP - General (Family Medicine) Rockey Situ, Kathlene November, MD as PCP - Cardiology (Cardiology) Manus Rudd, NP as Nurse Practitioner (Nurse Practitioner) Minna Merritts, MD as Consulting Physician (Cardiology) Odette Fraction [facility])   Medications: Outpatient Medications Prior to Visit  Medication Sig   losartan (COZAAR) 50 MG tablet Take 1 tablet (50 mg total) by mouth daily. Additional refill available at office visit.   Naproxen Sodium (ALEVE PO) Take by mouth. PRN   phentermine 15 MG capsule Take 1 capsule (15 mg total) by mouth every morning.   topiramate (TOPAMAX) 50 MG tablet Take 1 tablet (50 mg total) by mouth daily.   [DISCONTINUED] cyclobenzaprine (FLEXERIL) 5 MG tablet 1 tablet at bedtime (Patient not taking: Reported on 06/03/2022)   [DISCONTINUED] NON FORMULARY Vita Fusion Takes 1 tablet qd. (Patient not taking: Reported on 06/03/2022)   No facility-administered medications prior to visit.    Review of Systems  Constitutional:  Positive for fatigue.  HENT:  Positive for sinus pressure.   Eyes:  Positive for photophobia and redness.  Respiratory: Negative.    Cardiovascular:  Positive for leg swelling.  Gastrointestinal:  Positive for constipation.  Endocrine: Negative.   Genitourinary: Negative.   Musculoskeletal:  Positive for arthralgias, back pain, joint swelling, myalgias and neck pain.  Skin: Negative.   Allergic/Immunologic: Negative.   Neurological: Negative.   Hematological: Negative.   Psychiatric/Behavioral:  Positive for sleep disturbance.       Objective    BP (!) 142/82 (BP Location: Right Arm, Patient Position: Sitting, Cuff Size: Normal)   Pulse 70   Temp 98.4 F (36.9 C) (Oral)   Ht 5' 8"$  (1.727 m)    Wt 211 lb (95.7 kg)   SpO2 100%   BMI 32.08 kg/m  Vitals:   07/29/22 0848 07/29/22 0849  BP: (!) 149/78 (!) 142/82  Pulse: 70   Temp: 98.4 F (36.9 C)   TempSrc: Oral   SpO2: 100%   Weight: 211 lb (95.7 kg)   Height: 5' 8"$  (  1.727 m)     Physical Exam   General Appearance:    Mildly obese female. Alert, cooperative, in no acute distress, appears stated age   Head:    Normocephalic, without obvious abnormality, atraumatic  Eyes:    PERRL, conjunctiva/corneas clear, EOM's intact, fundi    benign, both eyes  Ears:    Normal TM's and external ear canals, both ears  Nose:   Nares normal, septum midline, mucosa normal, no drainage    or sinus tenderness  Throat:   Lips, mucosa, and tongue normal; teeth and gums normal  Neck:   Supple, symmetrical, trachea midline, no adenopathy;    thyroid:  no enlargement/tenderness/nodules; no carotid   bruit or JVD  Back:     Symmetric, no curvature, ROM normal, no CVA tenderness  Lungs:     Clear to auscultation bilaterally, respirations unlabored  Chest Wall:    No tenderness or deformity   Heart:    Normal heart rate. Normal rhythm. No murmurs, rubs, or gallops.   Breast Exam:    normal appearance, no masses or tenderness, No nipple retraction or dimpling, No nipple discharge or bleeding, No axillary or supraclavicular adenopathy  Abdomen:     Soft, non-tender, bowel sounds active all four quadrants,    no masses, no organomegaly  Pelvic:    cervix normal in appearance, external genitalia normal, no adnexal masses or tenderness, and vagina normal without discharge  Extremities:   All extremities are intact. No cyanosis or edema  Pulses:   2+ and symmetric all extremities  Skin:   Skin color, texture, turgor normal, no rashes or lesions  Lymph nodes:   Cervical, supraclavicular, and axillary nodes normal  Neurologic:   CNII-XII intact, normal strength, sensation and reflexes    throughout     Last depression screening scores    07/29/2022     8:47 AM 05/05/2022    4:32 PM 10/25/2021    2:42 PM  PHQ 2/9 Scores  PHQ - 2 Score 2 2 1  $ PHQ- 9 Score 6 7 3   $ Last fall risk screening    07/29/2022    8:47 AM  Fall Risk   Falls in the past year? 0  Number falls in past yr: 0  Injury with Fall? 0   Last Audit-C alcohol use screening    07/29/2022    8:47 AM  Alcohol Use Disorder Test (AUDIT)  1. How often do you have a drink containing alcohol? 1  2. How many drinks containing alcohol do you have on a typical day when you are drinking? 0  3. How often do you have six or more drinks on one occasion? 0  AUDIT-C Score 1   A score of 3 or more in women, and 4 or more in men indicates increased risk for alcohol abuse, EXCEPT if all of the points are from question 1   No results found for any visits on 07/29/22.  Assessment & Plan    Routine Health Maintenance and Physical Exam  Exercise Activities and Dietary recommendations  Goals   None     Immunization History  Administered Date(s) Administered   Influenza,inj,Quad PF,6+ Mos 03/02/2020, 04/23/2021   Influenza-Unspecified 04/28/2022   PFIZER(Purple Top)SARS-COV-2 Vaccination 09/10/2019, 10/08/2019   Td 07/02/2017   Tdap 11/18/2006   Zoster Recombinat (Shingrix) 07/02/2017, 09/14/2017    Health Maintenance  Topic Date Due   HIV Screening  Never done   COVID-19 Vaccine (3 - 2023-24 season) 02/14/2022  MAMMOGRAM  04/27/2022   PAP SMEAR-Modifier  07/02/2022   COLONOSCOPY (Pts 45-2yr Insurance coverage will need to be confirmed)  04/18/2027   DTaP/Tdap/Td (3 - Td or Tdap) 07/03/2027   INFLUENZA VACCINE  Completed   Hepatitis C Screening  Completed   Zoster Vaccines- Shingrix  Completed   HPV VACCINES  Aged Out    Discussed health benefits of physical activity, and encouraged her to engage in regular exercise appropriate for her age and condition.  1. Annual physical exam Mildly obese, otherwise normal exam. [facility]  2.  Cervical cancer screening  - Cytology - HPV w/PAP any interp. Reflex 16/18 genotyping if HPV + ([facility])  3. Primary hypertension Doing well current meds. BP usually in the 130s/80s. Was 120/70 at recent visit with Dr. GRockey Situ- Comprehensive metabolic panel - TSH - CBC - EKG 12-Lead  4. Mixed hyperlipidemia Diet controlled.  - Lipid Panel With LDL/HDL Ratio  5. BMI 32.0-32.9,adult Taking phentermine and topiramate intermittent which she feels is working well and toleragint.  - phentermine 15 MG capsule; Take 1 capsule (15 mg total) by mouth every morning.  Dispense: 30 capsule; Refill: 3 - topiramate (TOPAMAX) 50 MG tablet; Take 1 tablet (50 mg total) by mouth daily.  Dispense: 30 tablet; Refill: 03  6. Chronic midline low back pain with right-sided sciatica  - DG Lumbar Spine Complete; Future  7. Right hip pain  - DG Hip Unilat W OR W/O Pelvis 2-3 Views Right; Future  8. Hot flashes  - FSH/LH  9. Menopausal state Last MP was around August 2023 by patient report.      The entirety of the information documented in the History of Present Illness, Review of Systems and Physical Exam were personally obtained by me. Portions of this information were initially documented by the CMA and reviewed by me for thoroughness and accuracy.     DLelon Huh MD  [phone](phone) [phone](fax)  [place]

## 2022-07-29 NOTE — Patient Instructions (Addendum)
Go to the [facility] on [facility] for right hip and back Xray

## 2022-07-30 LAB — CBC
Hematocrit: 39.8 % (ref 34.0–46.6)
Hemoglobin: 13.4 g/dL (ref 11.1–15.9)
MCH: 29.7 pg (ref 26.6–33.0)
MCHC: 33.7 g/dL (ref 31.5–35.7)
MCV: 88 fL (ref 79–97)
Platelets: 300 10*3/uL (ref 150–450)
RBC: 4.51 x10E6/uL (ref 3.77–5.28)
RDW: 12 % (ref 11.7–15.4)
WBC: 3.8 10*3/uL (ref 3.4–10.8)

## 2022-07-30 LAB — COMPREHENSIVE METABOLIC PANEL
ALT: 15 IU/L (ref 0–32)
AST: 19 IU/L (ref 0–40)
Albumin/Globulin Ratio: 2 (ref 1.2–2.2)
Albumin: 4.4 g/dL (ref 3.8–4.9)
Alkaline Phosphatase: 114 IU/L (ref 44–121)
BUN/Creatinine Ratio: 21 (ref 9–23)
BUN: 14 mg/dL (ref 6–24)
Bilirubin Total: 0.2 mg/dL (ref 0.0–1.2)
CO2: 21 mmol/L (ref 20–29)
Calcium: 11.6 mg/dL — ABNORMAL HIGH (ref 8.7–10.2)
Chloride: 103 mmol/L (ref 96–106)
Creatinine, Ser: 0.66 mg/dL (ref 0.57–1.00)
Globulin, Total: 2.2 g/dL (ref 1.5–4.5)
Glucose: 95 mg/dL (ref 70–99)
Potassium: 4.7 mmol/L (ref 3.5–5.2)
Sodium: 136 mmol/L (ref 134–144)
Total Protein: 6.6 g/dL (ref 6.0–8.5)
eGFR: 101 mL/min/{1.73_m2} (ref >59)

## 2022-07-30 LAB — LIPID PANEL WITH LDL/HDL RATIO
Cholesterol, Total: 240 mg/dL — ABNORMAL HIGH (ref 100–199)
HDL: 77 mg/dL (ref >39)
LDL Chol Calc (NIH): 146 mg/dL — ABNORMAL HIGH (ref 0–99)
LDL/HDL Ratio: 1.9 ratio (ref 0.0–3.2)
Triglycerides: 98 mg/dL (ref 0–149)
VLDL Cholesterol Cal: 17 mg/dL (ref 5–40)

## 2022-07-30 LAB — FSH/LH
FSH: 34.7 m[IU]/mL
LH: 27 m[IU]/mL

## 2022-07-30 LAB — TSH: TSH: 0.605 u[IU]/mL (ref 0.450–4.500)

## 2022-08-01 LAB — CYTOLOGY - PAP
Comment: NEGATIVE
Diagnosis: NEGATIVE
High risk HPV: NEGATIVE

## 2022-08-13 ENCOUNTER — Other Ambulatory Visit: Payer: Self-pay

## 2022-08-13 DIAGNOSIS — M5440 Lumbago with sciatica, unspecified side: Secondary | ICD-10-CM

## 2022-08-13 DIAGNOSIS — M79641 Pain in right hand: Secondary | ICD-10-CM

## 2022-08-14 MED ORDER — MELOXICAM 7.5 MG PO TABS: 7.5000 mg | ORAL_TABLET | Freq: Every day | ORAL | 1 refills | Status: DC

## 2022-08-25 ENCOUNTER — Ambulatory Visit
Admission: RE | Admit: 2022-08-25 | Discharge: 2022-08-25 | Disposition: A | Discharge status: Home / Self Care | Payer: No Typology Code available for payment source | Source: Ambulatory Visit | Attending: Family Medicine | Admitting: Family Medicine

## 2022-08-25 ENCOUNTER — Ambulatory Visit
Admission: RE | Admit: 2022-08-25 | Discharge: 2022-08-25 | Disposition: A | Discharge status: Home / Self Care | Payer: No Typology Code available for payment source | Attending: Family Medicine | Admitting: Family Medicine

## 2022-08-25 DIAGNOSIS — M79641 Pain in right hand: Secondary | ICD-10-CM

## 2022-08-29 ENCOUNTER — Encounter: Payer: Self-pay | Admitting: Family Medicine

## 2022-08-29 LAB — HM MAMMOGRAPHY

## 2022-09-01 ENCOUNTER — Other Ambulatory Visit: Payer: Self-pay | Admitting: Family Medicine

## 2022-09-01 DIAGNOSIS — M19041 Primary osteoarthritis, right hand: Secondary | ICD-10-CM

## 2022-09-02 ENCOUNTER — Other Ambulatory Visit: Payer: Self-pay

## 2022-09-02 DIAGNOSIS — M19041 Primary osteoarthritis, right hand: Secondary | ICD-10-CM

## 2022-09-22 NOTE — Progress Notes (Signed)
   Vivien Rota DeSanto,acting as a scribe for Mila Merry, MD.,have documented all relevant documentation on the behalf of Mila Merry, MD,as directed by  Mila Merry, MD     Established patient visit   Patient: Tina Fuller   DOB: 10-12-1960   62 y.o. Female  MRN: [id number] Visit Date: 09/23/2022  Today's healthcare provider: Mila Merry, MD    Subjective    HPI   Patient is a 62 year old female who presents for follow up of her DDD. She reports that the Meloxicam is helping.  She no longer has pain radiating down the leg.She was also found to have severe OA in right hand on xray 08/28/2022  Patient also complains of having changes in her stool color.  She states it goes from dark to light and dark again.  She states she would like to have this checked  Medications: Outpatient Medications Prior to Visit  Medication Sig   losartan (COZAAR) 50 MG tablet Take 1 tablet (50 mg total) by mouth daily. Additional refill available at office visit.   meloxicam (MOBIC) 7.5 MG tablet Take 1 tablet (7.5 mg total) by mouth daily.   Naproxen Sodium (ALEVE PO) Take by mouth. PRN   phentermine 15 MG capsule Take 1 capsule (15 mg total) by mouth every morning.   topiramate (TOPAMAX) 50 MG tablet Take 1 tablet (50 mg total) by mouth daily.   No facility-administered medications prior to visit.    Review of Systems  Gastrointestinal:  Positive for diarrhea (little) and nausea. Negative for abdominal pain, blood in stool, constipation and vomiting.  Musculoskeletal:  Positive for arthralgias, back pain and gait problem.       Objective    BP 132/74 (BP Location: Left Arm, Patient Position: Sitting, Cuff Size: Normal)   Pulse 73   Temp 98.1 F (36.7 C) (Oral)   Wt 210 lb (95.3 kg)   LMP 01/24/2018   SpO2 100%   BMI 31.93 kg/m    Physical Exam   General: Appearance:    Overweight female in no acute distress  Eyes:    PERRL, conjunctiva/corneas clear, EOM's intact        Lungs:     Clear to auscultation bilaterally, respirations unlabored  Heart:    Normal heart rate. Normal rhythm. No murmurs, rubs, or gallops.    MS:   All extremities are intact.    Neurologic:   Awake, alert, oriented x 3. No apparent focal neurological defect.         Assessment & Plan     1. DDD (degenerative disc disease), cervical   2. DDD (degenerative disc disease), lumbar Pain has improved since starting meloxicam and will continue for now. Consider physical referral if meloxicam loses effectives.   3. Change in stool Given OC-Lyte to collect home stool samples.   4. Primary osteoarthritis of right hand Rheumatology referral in process.       The entirety of the information documented in the History of Present Illness, Review of Systems and Physical Exam were personally obtained by me. Portions of this information were initially documented by the CMA and reviewed by me for thoroughness and accuracy.     Mila Merry, MD  [facility] [phone] (phone) [phone] (fax)  [facility]

## 2022-09-23 ENCOUNTER — Ambulatory Visit (INDEPENDENT_AMBULATORY_CARE_PROVIDER_SITE_OTHER): Payer: No Typology Code available for payment source | Admitting: Family Medicine

## 2022-09-23 VITALS — BP 132/74 | HR 73 | Temp 98.1°F | Wt 210.0 lb

## 2022-09-23 DIAGNOSIS — M5136 Other intervertebral disc degeneration, lumbar region: Secondary | ICD-10-CM

## 2022-09-23 DIAGNOSIS — M19041 Primary osteoarthritis, right hand: Secondary | ICD-10-CM

## 2022-09-23 DIAGNOSIS — R195 Other fecal abnormalities: Secondary | ICD-10-CM

## 2022-09-23 DIAGNOSIS — M5030 Other cervical disc degeneration, unspecified cervical region: Secondary | ICD-10-CM | POA: Diagnosis not present

## 2022-10-07 IMAGING — CR DG CERVICAL SPINE COMPLETE 4+V
1 series · 6 of 6 positions shown · non-contrast
Comparison: None.

CLINICAL DATA: Left cervical neck pain. Patient reports pain
radiates into left shoulder for 2 months.

EXAM:
CERVICAL SPINE - COMPLETE 4+ VIEW

[Series 1: dg cervical spine complete · 0.14mm/px · 6 of 6 slices shown]
[im 1/6]
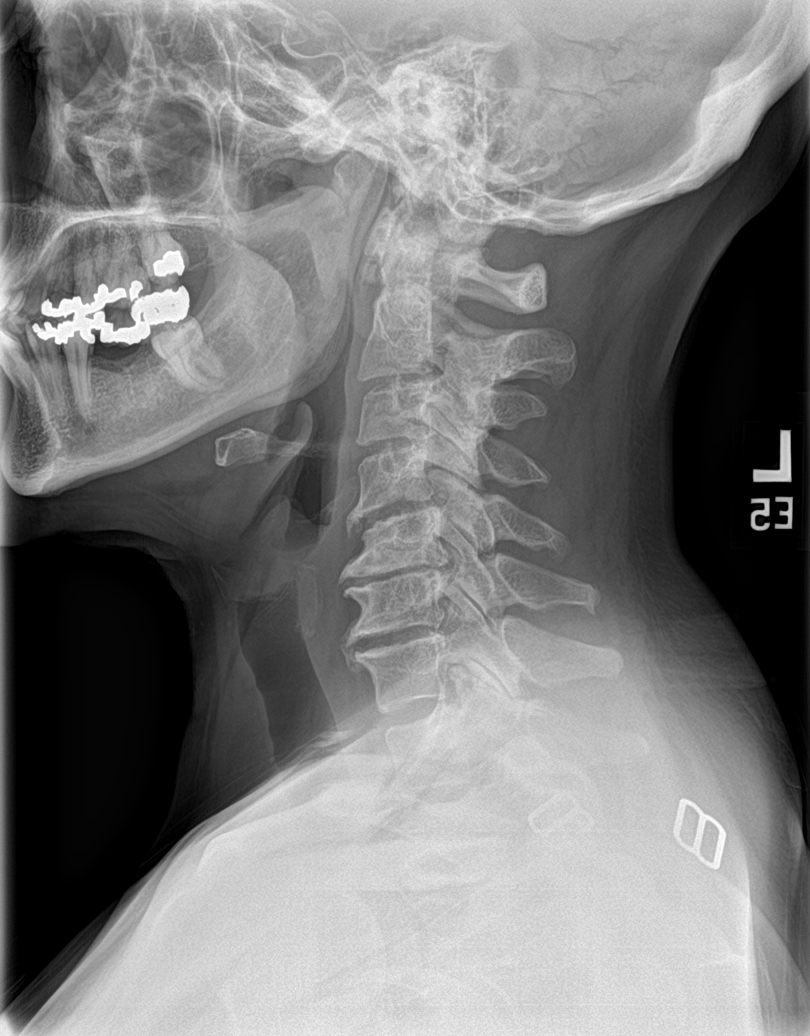
[im 2/6]
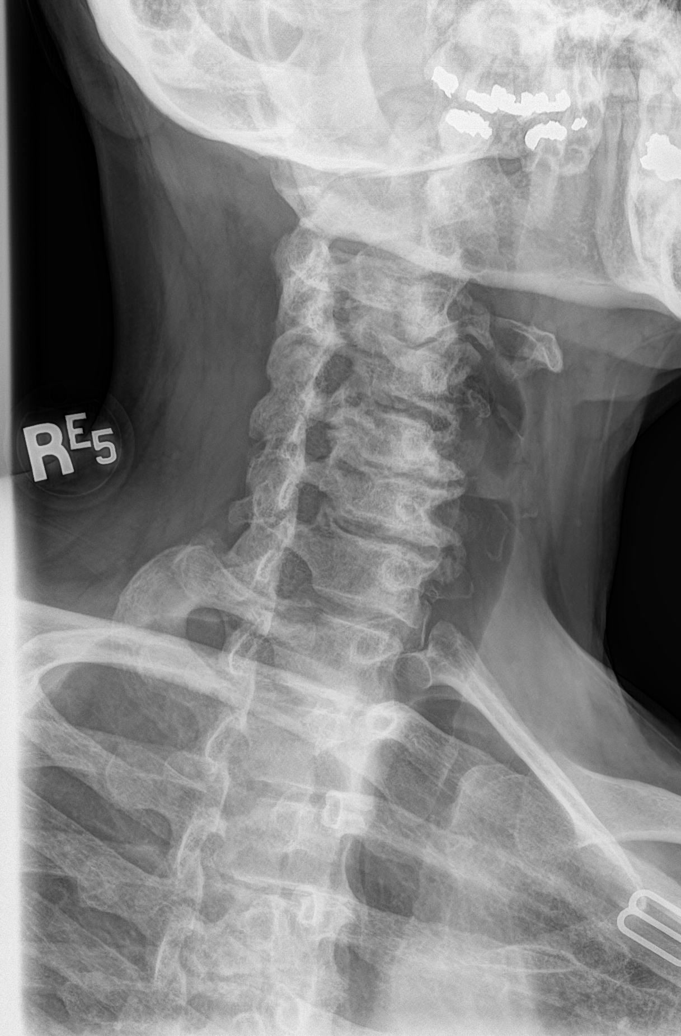
[im 3/6]
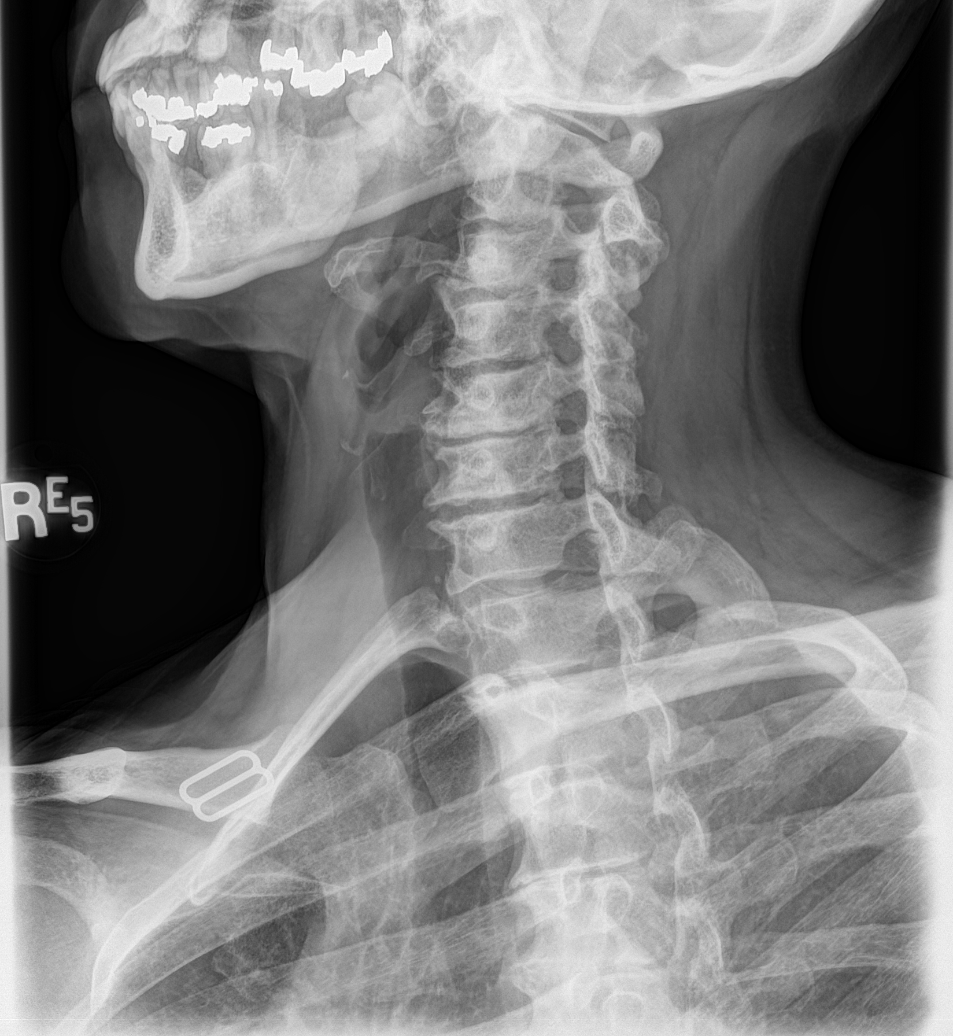
[im 4/6]
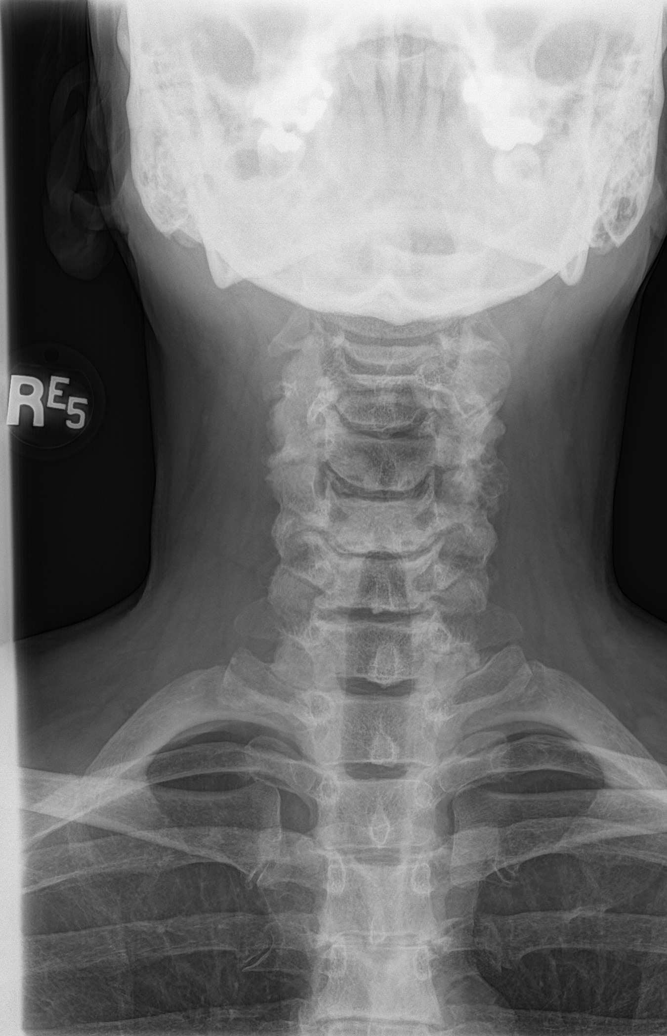
[im 5/6]
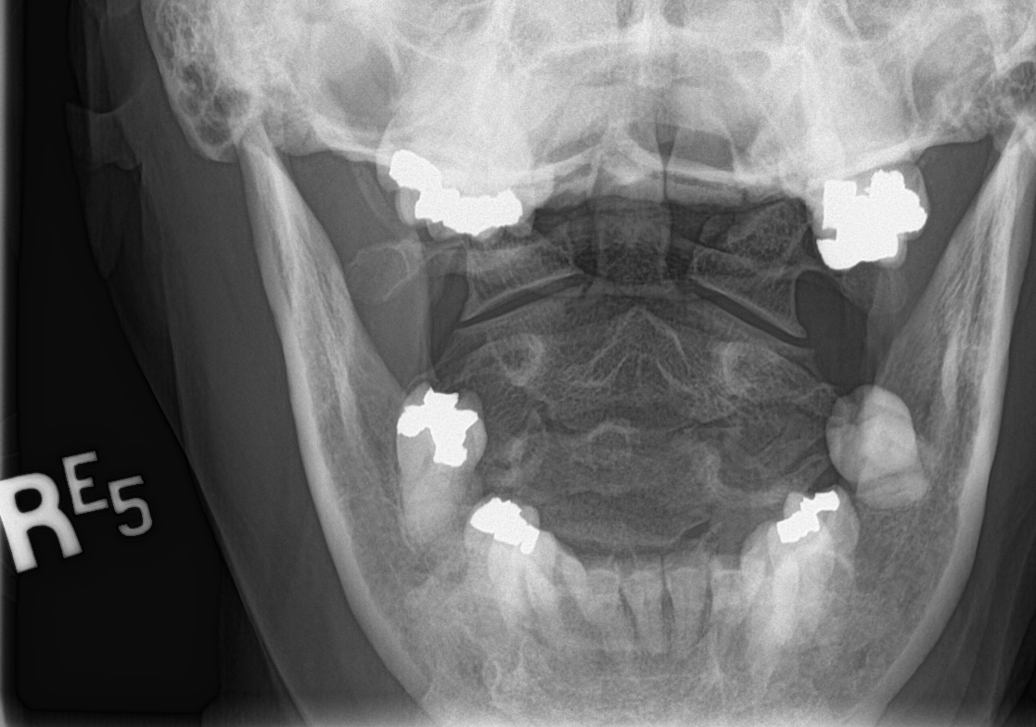
[im 6/6]
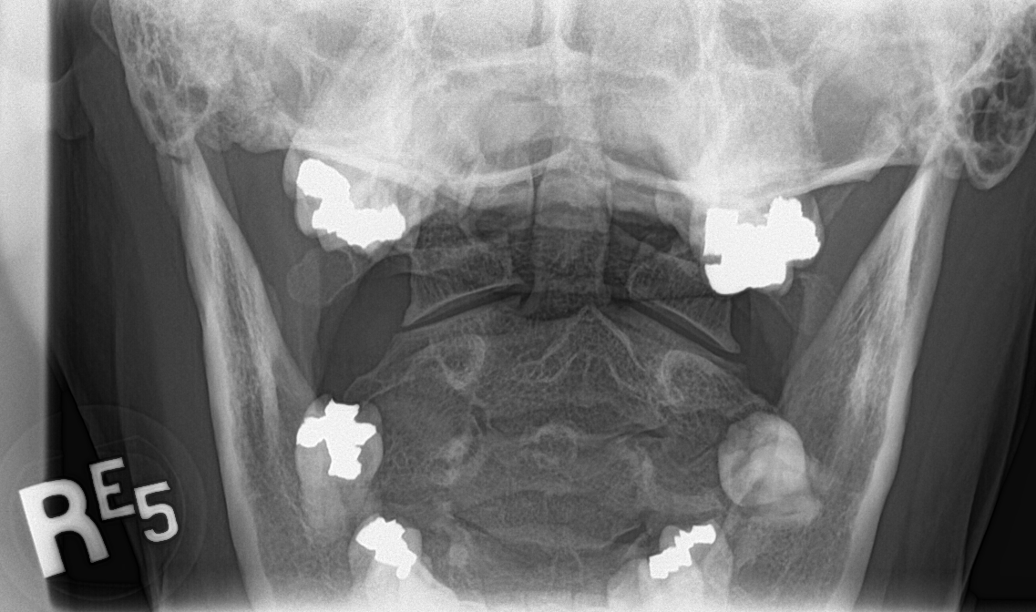

[6 of 6 positions shown; findings below may reference images not displayed]

FINDINGS: Straightening of normal lordosis. Trace anterolisthesis of C4 on C5
and retrolisthesis of C5 on C6 degenerative disc disease C4-C5,
C5-C6, and C6-C7. Vertebral body heights are normal. There is
multilevel facet hypertrophy, left greater than right. No
significant bony neural foraminal stenosis. No evidence of fracture,
focal bone lesion or bone destruction. No prevertebral soft tissue
edema. Lung apices are clear.
IMPRESSION: 1. Multilevel degenerative disc disease and facet hypertrophy in the
cervical spine.
2. Trace anterolisthesis of C4 on C5 and retrolisthesis of C5 on C6,
likely facet mediated.

## 2022-10-09 ENCOUNTER — Other Ambulatory Visit (INDEPENDENT_AMBULATORY_CARE_PROVIDER_SITE_OTHER): Payer: No Typology Code available for payment source

## 2022-10-09 ENCOUNTER — Other Ambulatory Visit: Payer: Self-pay

## 2022-10-09 ENCOUNTER — Telehealth: Payer: Self-pay

## 2022-10-09 DIAGNOSIS — M5440 Lumbago with sciatica, unspecified side: Secondary | ICD-10-CM

## 2022-10-09 DIAGNOSIS — R195 Other fecal abnormalities: Secondary | ICD-10-CM

## 2022-10-09 LAB — IFOBT (OCCULT BLOOD): IFOBT: NEGATIVE

## 2022-10-09 MED ORDER — MELOXICAM 7.5 MG PO TABS: 7.5000 mg | ORAL_TABLET | Freq: Every day | ORAL | 0 refills | Status: DC

## 2022-10-09 NOTE — Telephone Encounter (Signed)
Please clarify, there are no notes from 10-09-2022. I'm assuming she is talking about her back. If so then will need referral to physical therapy.

## 2022-10-09 NOTE — Telephone Encounter (Signed)
Pt given XR results per notes of Dr. Sherrie Mustache on 10/09/22. Pt verbalized understanding and states he is still having pain and meloxicam doesn't help all the way. Pt is asking does she need a referral to go elsewhere for the pain. Advised pt I would submit refill request for meloxicam and send message to provider for recommendations.

## 2022-10-09 NOTE — Telephone Encounter (Signed)
Requested Prescriptions  Pending Prescriptions Disp Refills   meloxicam (MOBIC) 7.5 MG tablet 90 tablet 1    Sig: Take 1 tablet (7.5 mg total) by mouth daily.     Analgesics:  COX2 Inhibitors Failed - 10/09/2022 12:05 PM      Failed - Manual Review: Labs are only required if the patient has taken medication for more than 8 weeks.      Passed - HGB in normal range and within 360 days    Hemoglobin  Date Value Ref Range Status  07/29/2022 13.4 11.1 - 15.9 g/dL Final         Passed - Cr in normal range and within 360 days    Creatinine, Ser  Date Value Ref Range Status  07/29/2022 0.66 0.57 - 1.00 mg/dL Final         Passed - HCT in normal range and within 360 days    Hematocrit  Date Value Ref Range Status  07/29/2022 39.8 34.0 - 46.6 % Final         Passed - AST in normal range and within 360 days    AST  Date Value Ref Range Status  07/29/2022 19 0 - 40 IU/L Final         Passed - ALT in normal range and within 360 days    ALT  Date Value Ref Range Status  07/29/2022 15 0 - 32 IU/L Final         Passed - eGFR is 30 or above and within 360 days    GFR calc Af Amer  Date Value Ref Range Status  12/28/2019 110 >59 mL/min/1.73 Final    Comment:    **[facility] currently reports eGFR in compliance with the current**   recommendations of the [employer]. [facility] will   update reporting as new guidelines are published from the NKF-ASN   Task force.    GFR calc non Af Amer  Date Value Ref Range Status  12/28/2019 96 >59 mL/min/1.73 Final   eGFR  Date Value Ref Range Status  07/29/2022 101 >59 mL/min/1.73 Final         Passed - Patient is not pregnant      Passed - Valid encounter within last 12 months    Recent Outpatient Visits           2 weeks ago    [facility] Malva Limes, MD   2 months ago Annual physical exam   [facility] Malva Limes, MD   5 months ago Pain of left hip    [facility] [employer], [place], [place]   11 months ago Bilateral leg edema   [place] [facility] Mecum, Oswaldo Conroy, PA-C   11 months ago History of pyelonephritis   [facility] Alfredia Ferguson, [place]

## 2022-10-13 NOTE — Telephone Encounter (Signed)
Pt returned call. She is agreeable to PT. She was referring to back pain.

## 2022-10-13 NOTE — Addendum Note (Signed)
Addended by: Malva Limes on: 10/13/2022 04:25 PM   Modules accepted: Orders

## 2023-01-26 ENCOUNTER — Other Ambulatory Visit: Payer: Self-pay | Admitting: Family Medicine

## 2023-01-26 DIAGNOSIS — Z6832 Body mass index (BMI) 32.0-32.9, adult: Secondary | ICD-10-CM

## 2023-06-17 ENCOUNTER — Other Ambulatory Visit: Payer: Self-pay | Admitting: Cardiovascular Disease

## 2023-06-18 ENCOUNTER — Telehealth: Payer: Self-pay | Admitting: Cardiovascular Disease

## 2023-06-18 NOTE — Telephone Encounter (Signed)
 Patient returned call and setup an appt for 03/03 with Dr. Mariah Milling.

## 2023-06-18 NOTE — Telephone Encounter (Signed)
 Left voice mail

## 2023-06-18 NOTE — Telephone Encounter (Signed)
 Patient returned call and setup an appointment for 03/03 with Dr. Mariah Milling.

## 2023-06-18 NOTE — Telephone Encounter (Signed)
 Left voicemail, pt needs appt scheduled from recall

## 2023-06-18 NOTE — Telephone Encounter (Signed)
 Please contact pt for future appointment. Pt due for 12 month f/u.

## 2023-06-25 ENCOUNTER — Ambulatory Visit: Payer: Self-pay | Admitting: *Deleted

## 2023-06-25 DIAGNOSIS — M5440 Lumbago with sciatica, unspecified side: Secondary | ICD-10-CM

## 2023-06-25 DIAGNOSIS — U071 COVID-19: Secondary | ICD-10-CM

## 2023-06-25 NOTE — Telephone Encounter (Signed)
 Attempted to return her call.   Left a voicemail to call back.

## 2023-06-25 NOTE — Telephone Encounter (Signed)
 Message from Tina Fuller [name] sent at 06/25/2023  1:39 PM EST  Summary: Back Pain, hip pain, inflammation   Pt Tina Fuller state that she is experiencing Back Pain, hip pain, inflammation.[name] would like to take a past medication but forgot the name          Call History  Contact Date/Time Type Contact Phone/Fax By  06/25/2023 01:36 PM EST Phone ([address] Tina Fuller, Tina Fuller (Self) [phone] Tina Fuller Tina Fuller, Tina Fuller

## 2023-06-26 NOTE — Telephone Encounter (Signed)
 Forwarded to [facility] since no return of call by day's end on 06/25/2023.

## 2023-06-26 NOTE — Telephone Encounter (Signed)
 Please review

## 2023-06-26 NOTE — Telephone Encounter (Signed)
 Is she requesting refill for Meloxicam?

## 2023-06-29 NOTE — Telephone Encounter (Signed)
 Left message for patient to return call to office.   PEC - if she returns call, please ask, per Dr. Sherrie Mustache, if she is possibly referring to Meloxicam?

## 2023-06-29 NOTE — Telephone Encounter (Signed)
 Pt returned our call. It is Meloxicam  that she had before. She would like this medication refilled.  Pt also requested refill of cough medicine.  Promethazine  - dextromethorphan - last prescribed by Kathrine Sable in 2022. PT states she has a cough and that worked very well for her. Please advise.

## 2023-07-01 MED ORDER — PROMETHAZINE-DM 6.25-15 MG/5ML PO SYRP
5.0000 mL | ORAL_SOLUTION | Freq: Every evening | ORAL | 0 refills | Status: DC | PRN
Start: 2023-07-01 — End: 2023-09-29

## 2023-07-01 MED ORDER — MELOXICAM 7.5 MG PO TABS
7.5000 mg | ORAL_TABLET | Freq: Every day | ORAL | 0 refills | Status: DC
Start: 2023-07-01 — End: 2023-09-24

## 2023-07-01 NOTE — Addendum Note (Signed)
 Addended by: Lamon Pillow on: 07/01/2023 01:32 PM   Modules accepted: Orders

## 2023-08-16 NOTE — Progress Notes (Unsigned)
 Cardiology Office Note  Date:  08/17/2023   ID:  Tina Fuller, Tina Fuller 11-04-1960, MRN [id number]  PCP:  Malva Limes, MD   Chief Complaint  Patient presents with   12 month follow up     Patient c/o palpitations & menopause.     HPI:  63 year old woman with  obesity,   smoker but stopped approximately 15 years ago. no known cardiac history, family history of diabetes and heart disease  seen in 2011 for tachycardia, chest tightness.  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  Having menopause sx, Mood swings, flashes is  For weight loss started on medications by PMD Off phentermine, no side effects Off topamax, no side effects Prefers more natural approach  Reports that she has changed her diet, staying active Blood pressure well-controlled  Rare palpitations Some cramping in her legs Toes feel cold  Prior studies reviewed, ABIs negative for PAD  No regular exercise program  Lab work reviewed Total chol 231, LDL 138 Previously declined cholesterol medication CT coronary calcium score previously offered  EKG personally reviewed by myself on todays visit EKG Interpretation Date/Time:  Monday August 17 2023 09:38:06 EST Ventricular Rate:  72 PR Interval:  152 QRS Duration:  80 QT Interval:  410 QTC Calculation: 448 R Axis:   35  Text Interpretation: Normal sinus rhythm with sinus arrhythmia Normal ECG No previous ECGs available Confirmed by Julien Nordmann [phone]) on 08/17/2023 9:56:03 AM    PMH:   has a past medical history of History of chicken pox, Low back pain, Muscle spasms of neck (08/19/2017), Palpitations, Trochanteric bursitis of left hip, Trochanteric bursitis of left hip (05/21/2015), and Tubular adenoma of colon  (08/2014).  PSH:    Past Surgical History:  Procedure Laterality Date   BREAST BIOPSY     Benign   COLONOSCOPY     COLONOSCOPY WITH PROPOFOL N/A 04/17/2020   Procedure: COLONOSCOPY WITH PROPOFOL;  Surgeon: Midge Minium, MD;  Location: [facility];  Service: Endoscopy;  Laterality: N/A;   fissurotomy     LIPOMA EXCISION Left    left side    Current Outpatient Medications  Medication Sig Dispense Refill   cholecalciferol (VITAMIN D3) 25 MCG (1000 UNIT) tablet Take 1,000 Units by mouth daily.     losartan (COZAAR) 50 MG tablet Take 1 tablet (50 mg total) by mouth daily. Pt needs to keep upcoming appt in Mar for further refills 90 tablet 0   Naproxen Sodium (ALEVE PO) Take by mouth. PRN     Specialty Vitamins Products (COLLAGEN ULTRA PO) Take by mouth daily.     meloxicam (MOBIC) 7.5 MG tablet Take 1 tablet (7.5 mg total) by mouth daily. (Patient not taking: Reported on 08/17/2023) 90 tablet 0   phentermine 15 MG capsule Take 1 capsule by mouth in the morning (Patient not taking: Reported on 08/17/2023) 30 capsule 2   promethazine-dextromethorphan (PROMETHAZINE-DM) 6.25-15 MG/5ML syrup Take 5 mLs by mouth at bedtime as needed for cough. (Patient not taking: Reported on 08/17/2023) 118 mL 0   topiramate (TOPAMAX) 50 MG tablet Take 1 tablet (50 mg total) by mouth daily. (Patient not taking: Reported on 08/17/2023) 30 tablet 03   No current facility-administered medications for this visit.  Allergies:   Bee pollen   Social History:  The patient  reports that she has quit smoking. Her smoking use included cigarettes. She has a 2 pack-year smoking history. She has never used smokeless tobacco. She reports current alcohol use. She reports that she does not use drugs.   Family History:   family history includes Alzheimer's disease in her mother; Cerebrovascular Accident in her father and mother; Diabetes in her brother, mother, and sister; Heart disease in her brother, brother, and sister; Heart  failure in her father, sister, and sister; Hyperlipidemia in her brother, brother, father, and mother; Hypertension in her brother, brother, father, and mother.    Review of Systems: Review of Systems  Constitutional: Negative.   Respiratory: Negative.    Cardiovascular: Negative.   Gastrointestinal: Negative.   Musculoskeletal:  Positive for myalgias.  Neurological: Negative.   Psychiatric/Behavioral: Negative.    All other systems reviewed and are negative.   PHYSICAL EXAM: VS:  BP 132/78 (BP Location: Left Arm, Patient Position: Sitting, Cuff Size: Normal)   Pulse 72   LMP 01/24/2018   SpO2 99%  , BMI There is no height or weight on file to calculate BMI. Constitutional:  oriented to person, place, and time. No distress.  HENT:  Head: Grossly normal Eyes:  no discharge. No scleral icterus.  Neck: No JVD, no carotid bruits  Cardiovascular: Regular rate and rhythm, no murmurs appreciated Pulmonary/Chest: Clear to auscultation bilaterally, no wheezes or rails Abdominal: Soft.  no distension.  no tenderness.  Musculoskeletal: Normal range of motion Neurological:  normal muscle tone. Coordination normal. No atrophy Skin: Skin warm and dry Psychiatric: normal affect, pleasant   Recent Labs: No results found for requested labs within last 365 days.    Lipid Panel Lab Results  Component Value Date   CHOL 240 (H) 07/29/2022   HDL 77 07/29/2022   LDLCALC 146 (H) 07/29/2022   TRIG 98 07/29/2022      Wt Readings from Last 3 Encounters:  09/23/22 210 lb (95.3 kg)  07/29/22 211 lb (95.7 kg)  06/03/22 210 lb 8 oz (95.5 kg)      ASSESSMENT AND PLAN:  Palpitations - Plan: EKG 12-Lead Rare palpitations, likely PACs, possibly exacerbated by stress For worsening symptoms Zio monitor could be ordered  Hyperlipidemia Repeat lipid panel ordered prior lipid panel with total cholesterol 230, declined cholesterol medication in the past We have previously discussed calcium  scoring  Shortness of breath  recommended continued walking program for conditioning  Paravertebral muscle spasms Stable symptoms, likely exacerbated by work  Essential hypertension Blood pressure is well controlled on today's visit. No changes made to the medications.     Orders Placed This Encounter  Procedures   EKG 12-Lead     Signed, Dossie Arbour, M.D., Ph.D. 08/17/2023  [facility] [place], [place] [phone]

## 2023-08-17 ENCOUNTER — Encounter: Payer: Self-pay | Admitting: Cardiovascular Disease

## 2023-08-17 ENCOUNTER — Ambulatory Visit
Payer: No Typology Code available for payment source | Attending: Cardiovascular Disease | Admitting: Cardiovascular Disease

## 2023-08-17 VITALS — BP 132/78 | HR 72

## 2023-08-17 DIAGNOSIS — R600 Localized edema: Secondary | ICD-10-CM

## 2023-08-17 DIAGNOSIS — R002 Palpitations: Secondary | ICD-10-CM | POA: Diagnosis not present

## 2023-08-17 DIAGNOSIS — I10 Essential (primary) hypertension: Secondary | ICD-10-CM

## 2023-08-17 DIAGNOSIS — E782 Mixed hyperlipidemia: Secondary | ICD-10-CM | POA: Diagnosis not present

## 2023-08-17 DIAGNOSIS — Z79899 Other long term (current) drug therapy: Secondary | ICD-10-CM

## 2023-08-17 MED ORDER — LOSARTAN POTASSIUM 50 MG PO TABS: 50.0000 mg | ORAL_TABLET | Freq: Every day | ORAL | 3 refills | Status: AC

## 2023-08-17 NOTE — Patient Instructions (Addendum)
Medication Instructions:  No changes  If you need a refill on your cardiac medications before your next appointment, please call your pharmacy.   Lab work: Lipids and CMP  Testing/Procedures: No new testing needed  Follow-Up: At [facility], you and your health needs are our priority.  As part of our continuing mission to provide you with exceptional heart care, we have created designated Provider Care Teams.  These Care Teams include your primary Cardiologist (physician) and Advanced Practice Providers (APPs -  Physician Assistants and Nurse Practitioners) who all work together to provide you with the care you need, when you need it.  You will need a follow up appointment in 12 months  Providers on your designated Care Team:   Nicolasa Ducking, NP Eula Listen, PA-C Cadence Fransico Michael, [place]  COVID-19 Vaccine Information can be found at: [email or web address] For questions related to vaccine distribution or appointments, please email [email or web address] or call [phone].

## 2023-08-18 LAB — COMPREHENSIVE METABOLIC PANEL
ALT: 24 IU/L (ref 0–32)
AST: 22 IU/L (ref 0–40)
Albumin: 4.5 g/dL (ref 3.8–4.9)
Alkaline Phosphatase: 110 IU/L (ref 44–121)
BUN/Creatinine Ratio: 25 (ref 12–28)
BUN: 14 mg/dL (ref 8–27)
Bilirubin Total: 0.3 mg/dL (ref 0.0–1.2)
CO2: 19 mmol/L — ABNORMAL LOW (ref 20–29)
Calcium: 10.4 mg/dL — ABNORMAL HIGH (ref 8.7–10.3)
Chloride: 107 mmol/L — ABNORMAL HIGH (ref 96–106)
Creatinine, Ser: 0.56 mg/dL — ABNORMAL LOW (ref 0.57–1.00)
Globulin, Total: 2.3 g/dL (ref 1.5–4.5)
Glucose: 88 mg/dL (ref 70–99)
Potassium: 4.5 mmol/L (ref 3.5–5.2)
Sodium: 140 mmol/L (ref 134–144)
Total Protein: 6.8 g/dL (ref 6.0–8.5)
eGFR: 104 mL/min/{1.73_m2} (ref >59)

## 2023-08-18 LAB — LIPID PANEL
Chol/HDL Ratio: 3.1 ratio (ref 0.0–4.4)
Cholesterol, Total: 254 mg/dL — ABNORMAL HIGH (ref 100–199)
HDL: 81 mg/dL (ref >39)
LDL Chol Calc (NIH): 155 mg/dL — ABNORMAL HIGH (ref 0–99)
Triglycerides: 107 mg/dL (ref 0–149)
VLDL Cholesterol Cal: 18 mg/dL (ref 5–40)

## 2023-08-21 ENCOUNTER — Encounter: Payer: Self-pay | Admitting: Cardiovascular Disease

## 2023-08-24 ENCOUNTER — Other Ambulatory Visit: Payer: Self-pay | Admitting: Emergency Medicine

## 2023-08-24 DIAGNOSIS — E782 Mixed hyperlipidemia: Secondary | ICD-10-CM

## 2023-08-24 MED ORDER — ROSUVASTATIN CALCIUM 5 MG PO TABS: 5.0000 mg | ORAL_TABLET | Freq: Every day | ORAL | 3 refills | Status: AC

## 2023-08-24 NOTE — Telephone Encounter (Signed)
 Patient is returning call. She says calls aren't going through, but she'll try to pay attention to her phone.

## 2023-09-03 ENCOUNTER — Ambulatory Visit
Admission: RE | Admit: 2023-09-03 | Discharge: 2023-09-03 | Disposition: A | Discharge status: Home / Self Care | Source: Ambulatory Visit | Attending: Cardiovascular Disease | Admitting: Cardiovascular Disease

## 2023-09-03 DIAGNOSIS — E782 Mixed hyperlipidemia: Secondary | ICD-10-CM | POA: Insufficient documentation

## 2023-09-04 ENCOUNTER — Encounter: Payer: Self-pay | Admitting: Cardiovascular Disease

## 2023-09-07 ENCOUNTER — Telehealth: Payer: Self-pay | Admitting: Cardiovascular Disease

## 2023-09-07 NOTE — Telephone Encounter (Signed)
 Pt returning nurses call from Friday regarding results. Please advise

## 2023-09-07 NOTE — Telephone Encounter (Signed)
 The patient has been notified of the result along with recommendations. Pt verbalized understanding. All questions (if any) were answered      Tina Iba, MD 09/04/2023  1:00 PM EDT     Calcium score Low calcium score 76 With cholesterol very high, would suggest starting cholesterol medication Crestor 5 mg daily

## 2023-09-10 ENCOUNTER — Telehealth: Payer: Self-pay | Admitting: Cardiovascular Disease

## 2023-09-10 NOTE — Telephone Encounter (Signed)
 Patient is returning call to discuss CT results.

## 2023-09-10 NOTE — Telephone Encounter (Signed)
Pt returning nurse call. Please advise.

## 2023-09-10 NOTE — Telephone Encounter (Signed)
 Called patient and left message for call back.

## 2023-09-11 NOTE — Telephone Encounter (Signed)
 Called patient and left message for call back.

## 2023-09-11 NOTE — Telephone Encounter (Signed)
 Called and spoke with patient. Notified her of the following from Dr. Mariah Milling.  Calcium score Low calcium score 76 With cholesterol very high, would suggest starting cholesterol medication Crestor 5 mg daily  Patient verbalizes understanding. Patient states that she has started taking Crestor 5 MG daily.

## 2023-09-11 NOTE — Telephone Encounter (Signed)
 Patient is calling back and would like to be able to know her results today.  Please advise

## 2023-09-24 ENCOUNTER — Encounter: Payer: Self-pay | Admitting: Physician Assistant

## 2023-09-24 ENCOUNTER — Ambulatory Visit (INDEPENDENT_AMBULATORY_CARE_PROVIDER_SITE_OTHER): Admitting: Physician Assistant

## 2023-09-24 VITALS — BP 128/55 | HR 72 | Resp 16 | Ht 68.0 in | Wt 214.8 lb

## 2023-09-24 DIAGNOSIS — M5030 Other cervical disc degeneration, unspecified cervical region: Secondary | ICD-10-CM | POA: Diagnosis not present

## 2023-09-24 DIAGNOSIS — N39498 Other specified urinary incontinence: Secondary | ICD-10-CM | POA: Diagnosis not present

## 2023-09-24 DIAGNOSIS — R159 Full incontinence of feces: Secondary | ICD-10-CM

## 2023-09-24 DIAGNOSIS — M5440 Lumbago with sciatica, unspecified side: Secondary | ICD-10-CM | POA: Diagnosis not present

## 2023-09-24 DIAGNOSIS — E782 Mixed hyperlipidemia: Secondary | ICD-10-CM | POA: Diagnosis not present

## 2023-09-24 MED ORDER — MELOXICAM 7.5 MG PO TABS: ORAL_TABLET | ORAL | 1 refills | Status: AC

## 2023-09-24 NOTE — Progress Notes (Signed)
 " Established patient visit  Patient: Tina Fuller   DOB: [birth year]   63 y.o. Female  MRN: [id number] Visit Date: 09/24/2023  Today's healthcare provider: Jolynn Spencer, PA-C   Chief Complaint  Patient presents with   Back Pain    Back pain due the Chronic Disease wants referral to specialist. Wants to restart Meloxicam . Been feeling like this for months    Subjective       Discussed the use of AI scribe software for clinical note transcription with the patient, who gave verbal consent to proceed.  History of Present Illness The patient, with a known history of degenerative disc disease from L3 to L5 and S1, presents with a three-month history of lower back and hip pain. The pain is described as pulsating and radiating down the legs, particularly when lifting the leg. The pain is severe, rated as a 9 out of 10, and is exacerbated by standing and lifting at work. The patient reports that physical therapy, both in a clinical setting and at home, has not provided relief.  In addition to the back pain, the patient reports episodes of urinary incontinence and a single episode of bowel incontinence. The patient did not feel these episodes occurring and only became aware of them afterwards. The patient is currently not on any medication for the back pain but has been prescribed a statin by a cardiologist for an unspecified condition.       09/24/2023    4:17 PM 07/29/2022    8:47 AM 05/05/2022    4:32 PM  Depression screen PHQ 2/9  Decreased Interest 2 2 2   Down, Depressed, Hopeless 2 0 0  PHQ - 2 Score 4 2 2   Altered sleeping 2 1 3   Tired, decreased energy 3 2 1   Change in appetite 2 1 1   Feeling bad or failure about yourself  0 0 0  Trouble concentrating 1 0 0  Moving slowly or fidgety/restless 1 0 0  Suicidal thoughts 0 0 0  PHQ-9 Score 13 6 7   Difficult doing work/chores Very difficult Somewhat difficult Very difficult      09/24/2023    4:18 PM  GAD 7 : Generalized  Anxiety Score  Nervous, Anxious, on Edge 2  Control/stop worrying 2  Worry too much - different things 2  Trouble relaxing 2  Restless 2  Easily annoyed or irritable 1  Afraid - awful might happen 0  Total GAD 7 Score 11  Anxiety Difficulty Somewhat difficult    Medications: Outpatient Medications Prior to Visit  Medication Sig   cholecalciferol (VITAMIN D3) 25 MCG (1000 UNIT) tablet Take 1,000 Units by mouth daily.   losartan  (COZAAR ) 50 MG tablet Take 1 tablet (50 mg total) by mouth daily. Pt needs to keep upcoming appt in Mar for further refills   promethazine -dextromethorphan (PROMETHAZINE -DM) 6.25-15 MG/5ML syrup Take 5 mLs by mouth at bedtime as needed for cough.   rosuvastatin  (CRESTOR ) 5 MG tablet Take 1 tablet (5 mg total) by mouth daily.   Specialty Vitamins Products (COLLAGEN ULTRA PO) Take by mouth daily.   topiramate  (TOPAMAX ) 50 MG tablet Take 1 tablet (50 mg total) by mouth daily.   [DISCONTINUED] Naproxen Sodium (ALEVE PO) Take by mouth. PRN   [DISCONTINUED] meloxicam  (MOBIC ) 7.5 MG tablet Take 1 tablet (7.5 mg total) by mouth daily. (Patient not taking: Reported on 09/24/2023)   No facility-administered medications prior to visit.    Review of Systems All negative Except see HPI  Objective    BP (!) 128/55 (BP Location: Right Arm, Patient Position: Sitting, Cuff Size: Normal)   Pulse 72   Resp 16   Ht 5' 8 (1.727 m)   Wt 214 lb 12.8 oz (97.4 kg)   LMP 01/24/2018   SpO2 99%   BMI 32.66 kg/m     Physical Exam Vitals reviewed.  Constitutional:      General: She is not in acute distress.    Appearance: Normal appearance. She is well-developed. She is not diaphoretic.  HENT:     Head: Normocephalic and atraumatic.  Eyes:     General: No scleral icterus.    Conjunctiva/sclera: Conjunctivae normal.  Neck:     Thyroid: No thyromegaly.  Cardiovascular:     Rate and Rhythm: Normal rate and regular rhythm.     Pulses: Normal pulses.     Heart  sounds: Normal heart sounds. No murmur heard. Pulmonary:     Effort: Pulmonary effort is normal. No respiratory distress.     Breath sounds: Normal breath sounds. No wheezing, rhonchi or rales.  Musculoskeletal:     Cervical back: Neck supple.     Right lower leg: No edema.     Left lower leg: No edema.  Lymphadenopathy:     Cervical: No cervical adenopathy.  Skin:    General: Skin is warm and dry.     Findings: No rash.  Neurological:     Mental Status: She is alert and oriented to person, place, and time. Mental status is at baseline.  Psychiatric:        Mood and Affect: Mood normal.        Behavior: Behavior normal.     No results found for any visits on 09/24/23.       Assessment & Plan Degenerative Disc Disease Acute on chronic Degenerative disc disease from L3 to L5 and S1 with severe lower back and hip pain, urinary incontinence, and  an episode of fecal incontinence. Denies reduced perineal sensation. Previous physical therapy ineffective. Meloxicam  provided some relief. Potential need for surgical intervention discussed. Referral to orthopedic and neurosurgeon recommended. - Prescribe meloxicam  for pain management. - Advise acetaminophen  in the morning before meloxicam . - Recommend diclofenac gel or menthol-based topical analgesics. - Suggest heat and ice therapy for flare-ups, 15-20 minutes max. - Encourage continued exercise and home physical therapy. - Advise using a soft brace for work support. - Refer to neurosurgeon considering a possibility of CES. - Discuss with Dr. Gasper this pt's care Consider MRI to rule out CES - Provide work note for light duty and rest until September 30, 2023.  Hyperlipidemia Recently started on statin by cardiologist. Concern about side effects discussed. No new symptoms noted. - Continue statin therapy as prescribed. - Advise monitoring for new muscle spasms or changes in pain character. Low back pain with sciatica, sciatica  laterality unspecified, unspecified back pain laterality, unspecified chronicity - meloxicam  (MOBIC ) 7.5 MG tablet; Take 1-2 tablets daily by mouth  Dispense: 60 tablet; Refill: 1 - Ambulatory referral to Neurosurgery DDD (degenerative disc disease), cervical (Primary) - Ambulatory referral to Neurosurgery  Other urinary incontinence - Ambulatory referral to Neurosurgery  Incontinence of feces, unspecified fecal incontinence type - Ambulatory referral to Neurosurgery   No orders of the defined types were placed in this encounter.   Return in about 4 weeks (around 10/22/2023) for CPE with Dr. Gasper.   The patient was advised to call back or seek an in-person evaluation if the symptoms  worsen or if the condition fails to improve as anticipated.  I discussed the assessment and treatment plan with the patient. The patient was provided an opportunity to ask questions and all were answered. The patient agreed with the plan and demonstrated an understanding of the instructions.  I, Leonte Horrigan, PA-C have reviewed all documentation for this visit. The documentation on 09/24/2023  for the exam, diagnosis, procedures, and orders are all accurate and complete.  Jolynn Spencer, [facility], MMS [facility] [phone] (phone) [phone] (fax)  [facility] "

## 2023-09-25 NOTE — Progress Notes (Signed)
 Referring Physician:  Lamon Pillow, MD [address] [place],  [place] [address]  Primary Physician:  Lamon Pillow, MD  History of Present Illness: 09/28/2023 Tina Fuller is here today with a chief complaint of low back pain x 4 months.  She denies any inciting event.  She states that she has low back pain that radiates to her right leg and stops just above the knee.  She states that it is present when she is lifting or sitting in 1 position for prolonged period of time.  She adds that moving and walking actually helps relieve her pain.  She adds that sometimes her right hip/low back pain extends into her groin.  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  She denies any numbness and tingling down her legs.  She been using Tylenol Motrin for pain which does help some.  She was recently prescribed meloxicam which she feels as though has made a huge difference.  She denies any changes to her gait.  She feels as though her right hip does feel weak doing some movements however.  No saddle anesthesia.  No incontinence to bowel or bladder.   Duration: 3-4 months Severity: 0/10  Timing: constant Bowel/Bladder Dysfunction: none  Conservative measures:  Physical therapy: has participated in 1 visit at [facility].  Multimodal medical therapy including regular antiinflammatories: meloxicam  Injections: no epidural steroid injections  Past Surgery: no spinal surgeries  DAISI KENTNER has no symptoms of cervical myelopathy.  The symptoms are causing a significant impact on the patient's life.   Review of Systems:  A 10 point review of systems is negative, except for the pertinent positives and negatives detailed in the HPI.  Past Medical History: Past Medical History:  Diagnosis Date   History of chicken pox    Low back pain    Muscle spasms of neck 08/19/2017   Palpitations    Trochanteric bursitis of  left hip    Trochanteric bursitis of left hip 05/21/2015   Tubular adenoma of colon 08/2014    Past Surgical History: Past Surgical History:  Procedure Laterality Date   BREAST BIOPSY     Benign   COLONOSCOPY     COLONOSCOPY WITH PROPOFOL N/A 04/17/2020   Procedure: COLONOSCOPY WITH PROPOFOL;  Surgeon: Marnee Sink, MD;  Location: [facility] ENDOSCOPY;  Service: Endoscopy;  Laterality: N/A;   fissurotomy     LIPOMA EXCISION Left    left side    Allergies: Allergies as of 09/28/2023 - Review Complete 09/28/2023  Allergen Reaction Noted   Bee pollen  02/13/2021    Medications: Outpatient Encounter Medications as of 09/28/2023  Medication Sig   cholecalciferol (VITAMIN D3) 25 MCG (1000 UNIT) tablet Take 1,000 Units by mouth daily.   losartan (COZAAR) 50 MG tablet Take 1 tablet (50 mg total) by mouth daily. Pt needs to keep upcoming appt in Mar for further refills   meloxicam (MOBIC) 7.5 MG tablet Take 1-2 tablets daily by mouth   promethazine-dextromethorphan (PROMETHAZINE-DM) 6.25-15 MG/5ML syrup Take 5 mLs by mouth at bedtime as needed for cough.   rosuvastatin (CRESTOR) 5 MG tablet Take 1 tablet (5 mg total) by mouth daily.   Specialty Vitamins Products (COLLAGEN ULTRA PO) Take by mouth daily.   [DISCONTINUED] topiramate (TOPAMAX) 50 MG tablet Take 1 tablet (50 mg total) by mouth daily.   No facility-administered encounter medications on file as of 09/28/2023.  Social History: Social History   Tobacco Use   Smoking status: Former    Current packs/day: 0.25    Average packs/day: 0.3 packs/day for 8.0 years (2.0 ttl pk-yrs)    Types: Cigarettes   Smokeless tobacco: Never   Tobacco comments:    smoked less than 1/2 ppd for 5-10 years   Vaping Use   Vaping status: Never Used  Substance Use Topics   Alcohol use: Yes    Comment: occasional    Drug use: No    Family Medical History: Family History  Problem Relation Age of Onset   Hypertension Mother    Hyperlipidemia  Mother    Diabetes Mother    Alzheimer's disease Mother    Cerebrovascular Accident Mother    Heart failure Father    Hyperlipidemia Father    Hypertension Father    Cerebrovascular Accident Father    Heart disease Sister    Diabetes Sister    Heart failure Sister    Heart failure Sister    Heart disease Brother    Hypertension Brother    Hyperlipidemia Brother    Diabetes Brother    Heart disease Brother    Hyperlipidemia Brother    Hypertension Brother    Breast cancer Neg Hx    Colon cancer Neg Hx     Physical Examination: @VITALWITHPAIN @  General: Patient is well developed, well nourished, calm, collected, and in no apparent distress. Attention to examination is appropriate.  Psychiatric: Patient is non-anxious.  Head:  Pupils equal, round, and reactive to light.  ENT:  Oral mucosa appears well hydrated.  Neck:   Supple.  Full range of motion.  Respiratory: Patient is breathing without any difficulty.  Extremities: No edema.  Vascular: Palpable dorsal pedal pulses.  Skin:   On exposed skin, there are no abnormal skin lesions.  NEUROLOGICAL:     Awake, alert, oriented to person, place, and time.  Speech is clear and fluent. Fund of knowledge is appropriate.   Cranial Nerves: Pupils equal round and reactive to light.  Facial tone is symmetric.   ROM of spine: Mild tenderness palpation of her lumbar paraspinals  Strength:  Side Iliopsoas Quads Hamstring PF DF EHL  R 4 5 5 5 5 5   L 5 5 5 5 5 5      Some pain with internal rotation of her right hip. Reflexes are 2+ patella, 1+ achilles.    Clonus is not present.  Toes are down-going.  Bilateral upper and lower extremity sensation is intact to light touch.    Gait is normal.   No difficulty with tandem gait.   No evidence of dysmetria noted.  Medical Decision Making  Imaging:  EXAM: LUMBAR SPINE - COMPLETE 4+ VIEW (07/2022)   COMPARISON:  None Available.   FINDINGS: Alignment is anatomic.  Vertebral body height is maintained. Endplate degenerative changes at L3-4, L4-5 and L5-S1. Marked loss of disc space height at L5-S1. Facet hypertrophy in the lower lumbar spine. No definite pars defects.   IMPRESSION: Degenerative disc disease from L3-4 to L5-S1, worst at L5-S1.  EXAM: DG HIP (WITH OR WITHOUT PELVIS) 2-3V RIGHT (07/2022)   COMPARISON:  None Available.   FINDINGS: Hip joint space is maintained. No degenerative changes. Hips are symmetric. Ovoid calcific or ossific density in the soft tissues medial to the lesser trochanter.   IMPRESSION: 1. No acute findings. 2. Calcific or ossific density in the soft tissues of the upper thigh, possibly posttraumatic in etiology.  I have personally reviewed the images and agree with the above interpretation.  Assessment and Plan: Tina Fuller is a pleasant 63 y.o. female is here today with a chief complaint of low back pain x 4 months.  She denies any inciting event.  She states that she has low back pain that radiates to her right leg and stops just above the knee.  She states that it is present when she is lifting or sitting in 1 position for prolonged period of time.  She adds that moving and walking actually helps relieve her pain.  She adds that sometimes her right hip/low back pain extends into her groin.  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  She denies any numbness and tingling down her legs.  She been using Tylenol Motrin for pain which does help some.  She was recently prescribed meloxicam which she feels as though has made a huge difference.  She denies any changes to her gait.  She feels as though her right hip does feel weak doing some movements however.  No saddle anesthesia.  No incontinence to bowel or bladder.  On examination she has some mild tenderness palpation of her lumbar paraspinals.  Some mild right hip flexion weakness.  She also has some pain with  internal rotation of her right hip.  No pathologic reflexes.  Lumbar spine and hip x-rays reviewed from 14 months ago.  She did have degenerative disc disease in her lumbar spine that was previously seen.  The previous x-rays of her hip did not show any advanced joint space narrowing.  It was a pleasure to see this patient in clinic today.  I am pleased that her new prescription of meloxicam is helping her so much with her pain.  I advised her to continue following with her primary care provider as this is doing so well with her pain.  We also discussed other over-the-counter measures that could continue to help with her pain.  She did go to physical therapy, but was unable to continue to go secondary to financial reasons.  I have provided her with continued back exercises via a home health program while in clinic today.  Will review x-rays once complete.  Plan to see back in approximately 6 weeks.  Encouraged to reach out to me if she has questions or concerns prior to this.    Thank you for involving me in the care of this patient.   I spent a total of 30 minutes in both face-to-face and non-face-to-face activities for this visit on the date of this encounter including repairing to the patient, obtaining and reviewing history, performing examination, counseling the patient, ordering additional tests, and documenting information, care coordination  Ludwig Safer, PA-C Dept. of Neurosurgery

## 2023-09-28 ENCOUNTER — Ambulatory Visit (INDEPENDENT_AMBULATORY_CARE_PROVIDER_SITE_OTHER): Admitting: Physician Assistant

## 2023-09-28 ENCOUNTER — Ambulatory Visit
Admission: RE | Admit: 2023-09-28 | Discharge: 2023-09-28 | Disposition: A | Discharge status: Home / Self Care | Attending: Physician Assistant | Admitting: Physician Assistant

## 2023-09-28 ENCOUNTER — Ambulatory Visit
Admission: RE | Admit: 2023-09-28 | Discharge: 2023-09-28 | Disposition: A | Discharge status: Home / Self Care | Source: Ambulatory Visit | Attending: Physician Assistant | Admitting: Physician Assistant

## 2023-09-28 VITALS — BP 132/84 | Ht 68.0 in | Wt 212.0 lb

## 2023-09-28 DIAGNOSIS — G8929 Other chronic pain: Secondary | ICD-10-CM | POA: Insufficient documentation

## 2023-09-28 DIAGNOSIS — M545 Low back pain, unspecified: Secondary | ICD-10-CM

## 2023-09-28 DIAGNOSIS — M25551 Pain in right hip: Secondary | ICD-10-CM

## 2023-09-29 ENCOUNTER — Encounter: Payer: Self-pay | Admitting: Family Medicine

## 2023-09-29 ENCOUNTER — Ambulatory Visit (INDEPENDENT_AMBULATORY_CARE_PROVIDER_SITE_OTHER): Admitting: Family Medicine

## 2023-09-29 VITALS — BP 131/80 | HR 69 | Resp 16 | Wt 215.6 lb

## 2023-09-29 DIAGNOSIS — E782 Mixed hyperlipidemia: Secondary | ICD-10-CM

## 2023-09-29 DIAGNOSIS — Z0000 Encounter for general adult medical examination without abnormal findings: Secondary | ICD-10-CM | POA: Diagnosis not present

## 2023-09-29 DIAGNOSIS — E66811 Obesity, class 1: Secondary | ICD-10-CM

## 2023-09-29 DIAGNOSIS — Z1231 Encounter for screening mammogram for malignant neoplasm of breast: Secondary | ICD-10-CM

## 2023-09-29 DIAGNOSIS — R931 Abnormal findings on diagnostic imaging of heart and coronary circulation: Secondary | ICD-10-CM

## 2023-09-29 DIAGNOSIS — I10 Essential (primary) hypertension: Secondary | ICD-10-CM

## 2023-09-29 DIAGNOSIS — E6609 Other obesity due to excess calories: Secondary | ICD-10-CM

## 2023-09-29 DIAGNOSIS — Z6832 Body mass index (BMI) 32.0-32.9, adult: Secondary | ICD-10-CM

## 2023-09-29 MED ORDER — WEGOVY 0.25 MG/0.5ML ~~LOC~~ SOAJ: 0.2500 mg | SUBCUTANEOUS | 0 refills | Status: DC

## 2023-09-29 MED ORDER — WEGOVY 0.5 MG/0.5ML ~~LOC~~ SOAJ: 0.5000 mg | SUBCUTANEOUS | 0 refills | Status: DC

## 2023-09-29 MED ORDER — WEGOVY 1 MG/0.5ML ~~LOC~~ SOAJ: 1.0000 mg | SUBCUTANEOUS | 0 refills | Status: DC

## 2023-09-29 NOTE — Patient Instructions (Signed)
 You should get a call from the mail order pharmacy about out of pocket cost of Wegovy (a.k.a. Ozempic)   We will contact you around the end of May to set up your routine blood tests, including a cholesterol test.   Please call the [facility] [phone]) to schedule a routine screening mammogram.

## 2023-10-02 DIAGNOSIS — R931 Abnormal findings on diagnostic imaging of heart and coronary circulation: Secondary | ICD-10-CM | POA: Insufficient documentation

## 2023-10-02 NOTE — Progress Notes (Signed)
 Complete physical exam   Patient: Tina Fuller   DOB: 1960/11/12   63 y.o. Female  MRN: [id number] Visit Date: 09/29/2023  Today's healthcare provider: Jeralene Mom, MD   Chief Complaint  Patient presents with   Annual Exam    Patient reports feeling fairly well.   Subjective    Discussed the use of AI scribe software for clinical note transcription with the patient, who gave verbal consent to proceed.  History of Present Illness   Tina Fuller is a 63 year old female with spinal disease who presents for a physical exam and follow-up on her back pain.  She experiences lower back pain radiating down her right leg. She recently consulted a neurosurgeon and underwent x-rays, though the results are pending review. She started meloxicam  a few days ago, which is helping to alleviate her pain. Her pain fluctuates, and she manages it with the prescribed medication and exercises recommended by the PA at neurosurgeon's office where she was seen yesterday.  No chest pain, heart flutters, or shortness of breath. She reports arthritis-related swelling in hands, feet, or ankles.  She manages her blood pressure with medication and reports stability, though she has not been checking it regularly at home. She recently started a rosuvastatin  prescribed by her cardiologist due to elevated cholesterol levels and CAC score of   She engages in physical activity by walking two to three times a week, primarily on a treadmill due to weather conditions affecting outdoor activity. She is attempting to increase her activity level.  She has a history of using phentermine  and topiramate  for weight management and is considering other options due to her spinal disease. She is interested in maintaining her weight to help manage her condition.     Lab Results  Component Value Date   CHOL 254 (H) 08/17/2023   HDL 81 08/17/2023   LDLCALC 155 (H) 08/17/2023   TRIG 107 08/17/2023   CHOLHDL 3.1  08/17/2023   Lab Results  Component Value Date   NA 140 08/17/2023   K 4.5 08/17/2023   CREATININE 0.56 (L) 08/17/2023   EGFR 104 08/17/2023   GLUCOSE 88 08/17/2023      Past Medical History:  Diagnosis Date   History of chicken pox    Low back pain    Muscle spasms of neck 08/19/2017   Palpitations    Trochanteric bursitis of left hip    Trochanteric bursitis of left hip 05/21/2015   Tubular adenoma of colon 08/2014   Past Surgical History:  Procedure Laterality Date   BREAST BIOPSY     Benign   COLONOSCOPY     COLONOSCOPY WITH PROPOFOL  N/A 04/17/2020   Procedure: COLONOSCOPY WITH PROPOFOL ;  Surgeon: Marnee Sink, MD;  Location: [facility] ENDOSCOPY;  Service: Endoscopy;  Laterality: N/A;   fissurotomy     LIPOMA EXCISION Left    left side   Social History   Socioeconomic History   Marital status: Married    Spouse name: Not on file   Number of children: 2   Years of education: GED   Highest education level: Not on file  Occupational History   Occupation: Hospital doctor: [employer]    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  Tobacco Use   Smoking status: Former    Current packs/day: 0.25    Average packs/day: 0.3 packs/day for 8.0 years (2.0 ttl pk-yrs)    Types: Cigarettes   Smokeless tobacco: Never   Tobacco comments:  smoked less than 1/2 ppd for 5-10 years   Vaping Use   Vaping status: Never Used  Substance and Sexual Activity   Alcohol use: Yes    Comment: occasional    Drug use: No   Sexual activity: Not on file  Other Topics Concern   Not on file  Social History Narrative   Divorced; full time; exercises regularly - walking    Social Drivers of Health   Financial Resource Strain: Low Risk  (10/22/2021)   Received from [facility], [facility]   Overall Financial Resource Strain (CARDIA)    Difficulty of Paying Living Expenses: Not very hard  Food Insecurity: No Food Insecurity (10/22/2021)   Received from [facility], [facility]   Hunger Vital Sign    Worried About Running Out of Food in the Last Year: Never true    Ran Out of Food in the Last Year: Never true  Transportation Needs: No Transportation Needs (10/22/2021)   Received from [facility], [facility]   [facility] - Transportation    In the past 12 months, has lack of transportation kept you from medical appointments or from getting medications?: No    Lack of Transportation (Non-Medical): No  Physical Activity: Not on file  Stress: Not on file  Social Connections: Not on file  Intimate Partner Violence: Not on file   Family Status  Relation Name Status   Mother  Deceased at age 52       HTN; non-insulin dependent diabetes, alzheimers   Father  Deceased at age 87       heart disease and CA   Sister  Deceased       CAD with a stent at 71    Sister  Alive   Brother  Alive       CHF and stroke   Brother  Alive       CAD in 57s   Neg Hx  (Not Specified)  No partnership data on file   Family History  Problem Relation Age of Onset   Hypertension Mother    Hyperlipidemia Mother    Diabetes Mother    Alzheimer's disease Mother    Cerebrovascular Accident Mother    Heart failure Father    Hyperlipidemia Father    Hypertension Father    Cerebrovascular Accident Father    Heart disease Sister    Diabetes Sister    Heart failure Sister    Heart failure Sister    Heart disease Brother    Hypertension Brother    Hyperlipidemia Brother    Diabetes Brother    Heart disease Brother    Hyperlipidemia Brother    Hypertension Brother    Breast cancer Neg Hx    Colon cancer Neg Hx    Allergies  Allergen Reactions   Bee Pollen     Patient Care Team: Lamon Pillow, MD as PCP - General (Family Medicine) Jerelene Monday, Deadra Everts, MD as PCP - Cardiology (Cardiology) Jason Mess, NP as Nurse Practitioner (Nurse Practitioner) Jerelene Monday, Deadra Everts, MD as Consulting  Physician (Cardiology) [facility])   Medications: Outpatient Medications Prior to Visit  Medication Sig   cholecalciferol (VITAMIN D3) 25 MCG (1000 UNIT) tablet Take 1,000 Units by mouth daily.   losartan  (COZAAR ) 50 MG tablet Take 1 tablet (50 mg total) by mouth daily. Pt needs to keep upcoming appt in Mar for further refills  meloxicam  (MOBIC ) 7.5 MG tablet Take 1-2 tablets daily by mouth   rosuvastatin  (CRESTOR ) 5 MG tablet Take 1 tablet (5 mg total) by mouth daily.   Specialty Vitamins Products (COLLAGEN ULTRA PO) Take by mouth daily.   [DISCONTINUED] promethazine -dextromethorphan (PROMETHAZINE -DM) 6.25-15 MG/5ML syrup Take 5 mLs by mouth at bedtime as needed for cough.   No facility-administered medications prior to visit.    Review of Systems  Constitutional:  Negative for appetite change, chills, fatigue and fever.  Respiratory:  Negative for chest tightness and shortness of breath.   Cardiovascular:  Negative for chest pain and palpitations.  Gastrointestinal:  Negative for abdominal pain, nausea and vomiting.  Neurological:  Negative for dizziness and weakness.      Objective    BP 131/80 (BP Location: Left Arm, Patient Position: Sitting, Cuff Size: Normal)   Pulse 69   Resp 16   Wt 215 lb 9.6 oz (97.8 kg)   LMP 01/24/2018   SpO2 100%   BMI 32.78 kg/m    Physical Exam  General Appearance:    Overweight female. Alert, cooperative, in no acute distress, appears stated age   Head:    Normocephalic, without obvious abnormality, atraumatic  Eyes:    PERRL, conjunctiva/corneas clear, EOM's intact, fundi    benign, both eyes  Ears:    Normal TM's and external ear canals, both ears  Nose:   Nares normal, septum midline, mucosa normal, no drainage    or sinus tenderness  Throat:   Lips, mucosa, and tongue normal; teeth and gums normal  Neck:   Supple, symmetrical, trachea midline, no adenopathy;    thyroid:  no enlargement/tenderness/nodules; no  carotid   bruit or JVD  Back:     Symmetric, no curvature, ROM normal, no CVA tenderness  Lungs:     Clear to auscultation bilaterally, respirations unlabored  Chest Wall:    No tenderness or deformity   Heart:    Normal heart rate. Normal rhythm. No murmurs, rubs, or gallops.   Breast Exam:    deferred  Abdomen:     Soft, non-tender, bowel sounds active all four quadrants,    no masses, no organomegaly  Pelvic:    deferred  Extremities:   All extremities are intact. No cyanosis or edema  Pulses:   2+ and symmetric all extremities  Skin:   Skin color, texture, turgor normal, no rashes or lesions  Lymph nodes:   Cervical, supraclavicular, and axillary nodes normal  Neurologic:   CNII-XII intact, normal strength, sensation and reflexes    throughout       Last depression screening scores    09/29/2023   11:15 AM 09/24/2023    4:17 PM 07/29/2022    8:47 AM  PHQ 2/9 Scores  PHQ - 2 Score 3 4 2   PHQ- 9 Score 11 13 6    Last fall risk screening    09/29/2023   11:14 AM  Fall Risk   Falls in the past year? 1  Number falls in past yr: 0  Injury with Fall? 0  Risk for fall due to : No Fall Risks   Last Audit-C alcohol use screening    07/29/2022    8:47 AM  Alcohol Use Disorder Test (AUDIT)  1. How often do you have a drink containing alcohol? 1  2. How many drinks containing alcohol do you have on a typical day when you are drinking? 0  3. How often do you have six or more drinks on  one occasion? 0  AUDIT-C Score 1   A score of 3 or more in women, and 4 or more in men indicates increased risk for alcohol abuse, EXCEPT if all of the points are from question 1   No results found for any visits on 09/29/23.  Assessment & Plan    Routine Health Maintenance and Physical Exam  Exercise Activities and Dietary recommendations  Goals   None     Immunization History  Administered Date(s) Administered   Influenza,inj,Quad PF,6+ Mos 03/02/2020, 04/23/2021    Influenza-Unspecified 04/28/2022   PFIZER(Purple Top)SARS-COV-2 Vaccination 09/10/2019, 10/08/2019   Td 07/02/2017   Tdap 11/18/2006   Zoster Recombinant(Shingrix ) 07/02/2017, 09/14/2017    Health Maintenance  Topic Date Due   HIV Screening  Never done   COVID-19 Vaccine (3 - Pfizer risk series) 11/05/2019   INFLUENZA VACCINE  01/15/2024   MAMMOGRAM  08/28/2024   Colonoscopy  04/18/2027   DTaP/Tdap/Td (3 - Td or Tdap) 07/03/2027   Cervical Cancer Screening (HPV/Pap Cotest)  07/30/2027   Hepatitis C Screening  Completed   Zoster Vaccines- Shingrix   Completed   HPV VACCINES  Aged Out   Meningococcal B Vaccine  Aged Out    Discussed health benefits of physical activity, and encouraged her to engage in regular exercise appropriate for her age and condition.   2. Primary hypertension Fairly well controlled. Continue current dose losartan  and work on weight loss.   3. Mixed hyperlipidemia Recently started on rosuvastatin  by Dr. Gollan as she elevated CAC score of LAD.   4. Encounter for screening mammogram for malignant neoplasm of breast  - MM 3D SCREENING MAMMOGRAM BILATERAL BREAST; Future  5. Class 1 obesity due to excess calories without serious comorbidity with body mass index (BMI) of 32.0 to 32.9 in adult Previously prescribed topiramate  and phentermine  which she tolerated well but was minimally effective. She would like to try a GLP-1 but not sure if covered by her insurance.   Since prescription for following Wegovy  titration to [facility] pharmacy. If not able to get filled cost effectively will consider another trial of phentermine /topiramate .   - Semaglutide -Weight Management (WEGOVY ) 0.25 MG/0.5ML SOAJ; Inject 0.25 mg into the skin once a week.  Dispense: 2 mL; Refill: 0 - Semaglutide -Weight Management (WEGOVY ) 0.5 MG/0.5ML SOAJ; Inject 0.5 mg into the skin once a week. Start 1 week after finishing 0.25mg  dose pen  Dispense: 2 mL; Refill: 0 - Semaglutide -Weight  Management (WEGOVY ) 1 MG/0.5ML SOAJ; Inject 1 mg into the skin once a week. Start 1 week after finishing 0.5mg  dose pen  Dispense: 2 mL; Refill: 0         Jeralene Mom, MD  [facility] [phone] (phone) [phone] (fax)  [facility]

## 2023-11-04 ENCOUNTER — Other Ambulatory Visit: Payer: Self-pay | Admitting: Family Medicine

## 2023-11-04 DIAGNOSIS — Z6832 Body mass index (BMI) 32.0-32.9, adult: Secondary | ICD-10-CM

## 2023-11-11 ENCOUNTER — Ambulatory Visit: Admitting: Physician Assistant

## 2023-12-18 ENCOUNTER — Other Ambulatory Visit: Payer: Self-pay | Admitting: Family Medicine

## 2023-12-18 DIAGNOSIS — Z6832 Body mass index (BMI) 32.0-32.9, adult: Secondary | ICD-10-CM

## 2023-12-21 ENCOUNTER — Ambulatory Visit: Admitting: Physician Assistant

## 2023-12-22 ENCOUNTER — Encounter: Payer: Self-pay | Admitting: Physician Assistant

## 2023-12-22 ENCOUNTER — Ambulatory Visit (INDEPENDENT_AMBULATORY_CARE_PROVIDER_SITE_OTHER): Admitting: Physician Assistant

## 2023-12-22 VITALS — BP 138/82 | Ht 68.0 in | Wt 215.0 lb

## 2023-12-22 DIAGNOSIS — M545 Low back pain, unspecified: Secondary | ICD-10-CM

## 2023-12-22 NOTE — Progress Notes (Signed)
 Referring Physician:  Gasper Nancyann BRAVO, MD [address] [place],  [place] [address]  Primary Physician:  Tina Nancyann BRAVO, MD  History of Present Illness: 12/22/2023 [name] is here today with a chief complaint of continued low back pain x 6 months.  She states it is constant in her low back.  She denies radiation down her legs.  She does occasionally have burning and tingling in top of her left foot into her toes.  She is doing some home exercises for physical therapy currently which is helping some as well as continuing to take meloxicam .  She does feel as though her legs feel more weak, left worse than right.  She denies any falls or trouble walking at this time.  She adds that standing for long period of time increases her pain however when she walks it helps decrease the pain.  No saddle anesthesia or incontinence of bowel or bladder.    Duration: 3-4 months Severity: 0/10  Timing: constant Bowel/Bladder Dysfunction: none  Conservative measures:  Physical therapy: has participated in 1 visit at [facility].  Multimodal medical therapy including regular antiinflammatories: meloxicam   Injections: no epidural steroid injections  Past Surgery: no spinal surgeries  Tina Fuller has no symptoms of cervical myelopathy.  The symptoms are causing a significant impact on the patient's life.   Review of Systems:  A 10 point review of systems is negative, except for the pertinent positives and negatives detailed in the HPI.  Past Medical History: Past Medical History:  Diagnosis Date   History of chicken pox    Low back pain    Muscle spasms of neck 08/19/2017   Palpitations    Trochanteric bursitis of left hip    Trochanteric bursitis of left hip 05/21/2015   Tubular adenoma of colon 08/2014    Past Surgical History: Past Surgical History:  Procedure Laterality Date   BREAST BIOPSY     Benign   COLONOSCOPY     COLONOSCOPY WITH PROPOFOL  N/A  04/17/2020   Procedure: COLONOSCOPY WITH PROPOFOL ;  Surgeon: Jinny Carmine, MD;  Location: [facility] ENDOSCOPY;  Service: Endoscopy;  Laterality: N/A;   fissurotomy     LIPOMA EXCISION Left    left side    Allergies: Allergies as of 12/22/2023 - Review Complete 12/22/2023  Allergen Reaction Noted   Bee pollen  02/13/2021    Medications: Outpatient Encounter Medications as of 12/22/2023  Medication Sig   cholecalciferol (VITAMIN D3) 25 MCG (1000 UNIT) tablet Take 1,000 Units by mouth daily.   losartan  (COZAAR ) 50 MG tablet Take 1 tablet (50 mg total) by mouth daily. Pt needs to keep upcoming appt in Mar for further refills   meloxicam  (MOBIC ) 7.5 MG tablet Take 1-2 tablets daily by mouth   phentermine  15 MG capsule Take 1 capsule by mouth in the morning   rosuvastatin  (CRESTOR ) 5 MG tablet Take 1 tablet (5 mg total) by mouth daily.   Specialty Vitamins Products (COLLAGEN ULTRA PO) Take by mouth daily.   Semaglutide -Weight Management (WEGOVY ) 0.25 MG/0.5ML SOAJ Inject 0.25 mg into the skin once a week.   Semaglutide -Weight Management (WEGOVY ) 0.5 MG/0.5ML SOAJ Inject 0.5 mg into the skin once a week. Start 1 week after finishing 0.25mg  dose pen   Semaglutide -Weight Management (WEGOVY ) 1 MG/0.5ML SOAJ Inject 1 mg into the skin once a week. Start 1 week after finishing 0.5mg  dose pen   No facility-administered encounter medications on file as of 12/22/2023.    Social  History: Social History   Tobacco Use   Smoking status: Former    Current packs/day: 0.25    Average packs/day: 0.3 packs/day for 8.0 years (2.0 ttl pk-yrs)    Types: Cigarettes   Smokeless tobacco: Never   Tobacco comments:    smoked less than 1/2 ppd for 5-10 years   Vaping Use   Vaping status: Never Used  Substance Use Topics   Alcohol use: Yes    Comment: occasional    Drug use: No    Family Medical History: Family History  Problem Relation Age of Onset   Hypertension Mother    Hyperlipidemia Mother    Diabetes  Mother    Alzheimer's disease Mother    Cerebrovascular Accident Mother    Heart failure Father    Hyperlipidemia Father    Hypertension Father    Cerebrovascular Accident Father    Heart disease Sister    Diabetes Sister    Heart failure Sister    Heart failure Sister    Heart disease Brother    Hypertension Brother    Hyperlipidemia Brother    Diabetes Brother    Heart disease Brother    Hyperlipidemia Brother    Hypertension Brother    Breast cancer Neg Hx    Colon cancer Neg Hx     Physical Examination: @VITALWITHPAIN @  General: Patient is well developed, well nourished, calm, collected, and in no apparent distress. Attention to examination is appropriate.  Psychiatric: Patient is non-anxious.  Head:  Pupils equal, round, and reactive to light.  ENT:  Oral mucosa appears well hydrated.  Neck:   Supple.  Full range of motion.  Respiratory: Patient is breathing without any difficulty.  Extremities: No edema.  Vascular: Palpable dorsal pedal pulses.  Skin:   On exposed skin, there are no abnormal skin lesions.  NEUROLOGICAL:     Awake, alert, oriented to person, place, and time.  Speech is clear and fluent. Fund of knowledge is appropriate.   Cranial Nerves: Pupils equal round and reactive to light.  Facial tone is symmetric.   ROM of spine: Mild tenderness palpation of her lumbar paraspinals  Strength:  Side Iliopsoas Quads Hamstring PF DF EHL  R 4 5 5 5 5 5   L 5 5 5 5 5 5      Reflexes are 2+ patella, 1+ achilles.    Clonus is not present.  Toes are down-going.  Bilateral upper and lower extremity sensation is intact to light touch.    Gait is normal.   No difficulty with tandem gait.   No evidence of dysmetria noted.  Medical Decision Making  Imaging:  EXAM: LUMBAR SPINE - COMPLETE 4+ VIEW (07/2022)   COMPARISON:  None Available.   FINDINGS: Alignment is anatomic. Vertebral body height is maintained. Endplate degenerative changes at L3-4,  L4-5 and L5-S1. Marked loss of disc space height at L5-S1. Facet hypertrophy in the lower lumbar spine. No definite pars defects.   IMPRESSION: Degenerative disc disease from L3-4 to L5-S1, worst at L5-S1.  EXAM: DG HIP (WITH OR WITHOUT PELVIS) 2-3V RIGHT (07/2022)   COMPARISON:  None Available.   FINDINGS: Hip joint space is maintained. No degenerative changes. Hips are symmetric. Ovoid calcific or ossific density in the soft tissues medial to the lesser trochanter.   IMPRESSION: 1. No acute findings. 2. Calcific or ossific density in the soft tissues of the upper thigh, possibly posttraumatic in etiology.   EXAM: LUMBAR SPINE - COMPLETE 4+ VIEW   COMPARISON:  July 29, 2022   FINDINGS: There is no evidence of lumbar spine fracture. Mild curvature of spine. Moderate degenerative joint changes with narrowed intervertebral spaces minimal anterior spurring noted at L3 through L5. Facet joint hypertrophy noted in the lower lumbar spine. Anterior spurring of lower thoracic spine noted.   IMPRESSION: Degenerative joint changes of lower lumbar spine.    I have personally reviewed the images and agree with the above interpretation.  Assessment and Plan: Ms. Mulkern is a pleasant 63 y.o. female is here today with a chief complaint of low back pain x 6 months that has been unrelieved by home exercises/physical therapy and over-the-counter modalities.  Moving forward plan includes:  - MRI of lumbar spine.  Will review once complete.  Patient states that she would be potentially amenable to injections in the future.   Thank you for involving me in the care of this patient.   I spent a total of 30 minutes in both face-to-face and non-face-to-face activities for this visit on the date of this encounter including repairing to the patient, obtaining and reviewing history, performing examination, counseling the patient, ordering additional tests, and documenting information, care  coordination  Lyle Decamp, PA-C Dept. of Neurosurgery

## 2023-12-24 ENCOUNTER — Encounter: Payer: Self-pay | Admitting: Physician Assistant

## 2023-12-26 ENCOUNTER — Ambulatory Visit
Admission: RE | Admit: 2023-12-26 | Discharge: 2023-12-26 | Disposition: A | Discharge status: Home / Self Care | Source: Ambulatory Visit | Attending: Physician Assistant | Admitting: Physician Assistant

## 2023-12-26 DIAGNOSIS — G8929 Other chronic pain: Secondary | ICD-10-CM

## 2023-12-30 ENCOUNTER — Ambulatory Visit: Payer: Self-pay | Admitting: Physician Assistant

## 2024-01-15 ENCOUNTER — Telehealth: Payer: Self-pay | Admitting: Family Medicine

## 2024-01-15 DIAGNOSIS — Z6832 Body mass index (BMI) 32.0-32.9, adult: Secondary | ICD-10-CM

## 2024-01-15 NOTE — Telephone Encounter (Signed)
 Copied from CRM [id number]. Topic: Clinical - Prescription Issue >> Jan 15, 2024  1:59 PM Ivette P wrote: Reason for CRM: pt wanted to advise she cannot afford the wegovy  medication and would like to stay on phentermine  15 MG capsule.

## 2024-01-20 MED ORDER — PHENTERMINE HCL 15 MG PO CAPS: 15.0000 mg | ORAL_CAPSULE | Freq: Every morning | ORAL | 1 refills | Status: DC

## 2024-01-20 NOTE — Addendum Note (Signed)
 Addended by: GASPER NANCYANN BRAVO on: 01/20/2024 04:53 PM   Modules accepted: Orders

## 2024-02-11 ENCOUNTER — Telehealth: Payer: Self-pay

## 2024-02-11 NOTE — Telephone Encounter (Signed)
 Copied from CRM #[id number]. Topic: General - Other >> Feb 10, 2024  3:22 PM Rosaria BRAVO wrote: Reason for CRM: Pt called reporting that she needs a letter for her job.   I have issues with my back, I have a spinal disease, I have menopause, I need all of this in writing. Says these conditions effect her ability to do her job.  Pt wants this printed so she can pick it up. She just finished talking to HR and she says they need something in writing.   She also wants to sign up for disability she says, has questions.   Best contact: [phone]

## 2024-02-12 NOTE — Telephone Encounter (Signed)
 Needs office visit or virtual appointment to go over this.

## 2024-02-12 NOTE — Telephone Encounter (Signed)
 Called patient. No answer/mailbox is full. Please call patient and offer appointment with Dr.Fisher. Thanks.

## 2024-03-21 ENCOUNTER — Ambulatory Visit (INDEPENDENT_AMBULATORY_CARE_PROVIDER_SITE_OTHER): Admitting: Family Medicine

## 2024-03-21 ENCOUNTER — Encounter: Payer: Self-pay | Admitting: Family Medicine

## 2024-03-21 VITALS — BP 123/71 | HR 68 | Ht 67.0 in | Wt 206.0 lb

## 2024-03-21 DIAGNOSIS — E782 Mixed hyperlipidemia: Secondary | ICD-10-CM | POA: Diagnosis not present

## 2024-03-21 DIAGNOSIS — Z23 Encounter for immunization: Secondary | ICD-10-CM | POA: Diagnosis not present

## 2024-03-21 DIAGNOSIS — I10 Essential (primary) hypertension: Secondary | ICD-10-CM

## 2024-03-21 DIAGNOSIS — M5440 Lumbago with sciatica, unspecified side: Secondary | ICD-10-CM | POA: Diagnosis not present

## 2024-03-21 DIAGNOSIS — Z6832 Body mass index (BMI) 32.0-32.9, adult: Secondary | ICD-10-CM | POA: Diagnosis not present

## 2024-03-21 DIAGNOSIS — M5030 Other cervical disc degeneration, unspecified cervical region: Secondary | ICD-10-CM

## 2024-03-21 DIAGNOSIS — N951 Menopausal and female climacteric states: Secondary | ICD-10-CM

## 2024-03-21 MED ORDER — PHENTERMINE HCL 15 MG PO CAPS: 15.0000 mg | ORAL_CAPSULE | Freq: Every morning | ORAL | 4 refills | Status: AC

## 2024-03-24 ENCOUNTER — Ambulatory Visit: Payer: Self-pay | Admitting: Family Medicine

## 2024-03-24 LAB — COMPREHENSIVE METABOLIC PANEL WITH GFR
ALT: 19 IU/L (ref 0–32)
AST: 19 IU/L (ref 0–40)
Albumin: 4.5 g/dL (ref 3.8–4.9)
Alkaline Phosphatase: 106 IU/L (ref 49–135)
BUN/Creatinine Ratio: 19 (ref 12–28)
BUN: 13 mg/dL (ref 8–27)
Bilirubin Total: 0.3 mg/dL (ref 0.0–1.2)
CO2: 22 mmol/L (ref 20–29)
Calcium: 10.6 mg/dL — ABNORMAL HIGH (ref 8.7–10.3)
Chloride: 104 mmol/L (ref 96–106)
Creatinine, Ser: 0.7 mg/dL (ref 0.57–1.00)
Globulin, Total: 2.1 g/dL (ref 1.5–4.5)
Glucose: 93 mg/dL (ref 70–99)
Potassium: 4.1 mmol/L (ref 3.5–5.2)
Sodium: 141 mmol/L (ref 134–144)
Total Protein: 6.6 g/dL (ref 6.0–8.5)
eGFR: 99 mL/min/1.73 (ref >59)

## 2024-03-24 LAB — LIPID PANEL
Chol/HDL Ratio: 2.3 ratio (ref 0.0–4.4)
Cholesterol, Total: 186 mg/dL (ref 100–199)
HDL: 80 mg/dL (ref >39)
LDL Chol Calc (NIH): 92 mg/dL (ref 0–99)
Triglycerides: 76 mg/dL (ref 0–149)
VLDL Cholesterol Cal: 14 mg/dL (ref 5–40)

## 2024-03-25 ENCOUNTER — Other Ambulatory Visit: Payer: Self-pay | Admitting: Family Medicine

## 2024-03-25 DIAGNOSIS — Z1231 Encounter for screening mammogram for malignant neoplasm of breast: Secondary | ICD-10-CM

## 2024-04-06 ENCOUNTER — Encounter

## 2024-04-06 DIAGNOSIS — Z1231 Encounter for screening mammogram for malignant neoplasm of breast: Secondary | ICD-10-CM

## 2024-04-11 NOTE — Progress Notes (Signed)
 Established patient visit   Patient: Tina Fuller   DOB: 07-30-1960   63 y.o. Female  MRN: [id number] Visit Date: 03/21/2024  Today's healthcare provider: Nancyann Perry, MD   Chief Complaint  Patient presents with   Medical Management of Chronic Issues   Subjective    Discussed the use of AI scribe software for clinical note transcription with the patient, who gave verbal consent to proceed.  History of Present Illness   Tina Fuller is a 63 year old female with spinal disease and arthritis who presents with difficulty performing her job duties due to health issues.  She experiences significant difficulty performing her job duties due to spinal disease and arthritis. Her job in a [employer] requires lifting heavy boxes, often weighing 50 pounds or more, which has become increasingly challenging. She can now only lift about 15 pounds before experiencing significant back pain. Her job involves constant standing and lifting, exacerbating her symptoms.  She has been diagnosed with a spinal disease and has seen a specialist who prescribed meloxicam . Surgery was deemed unnecessary at this time. Her back and foot, which previously underwent surgery, are significantly affected by her work activities. She declined a steroid treatment offered by the specialist and is currently managing her symptoms with medication.  She has been taking phentermine , which has helped her lose some weight and reduce cravings. However, she stopped taking topiramate  as she believed it was worsening her back pain. She is also on medication for blood pressure and cholesterol.  She has not yet spoken to her HR department about disability. She is uncertain about the process for applying for disability and is considering both private insurance and social security options.       Medications: Outpatient Medications Prior to Visit  Medication Sig   cholecalciferol (VITAMIN D3) 25 MCG (1000 UNIT) tablet  Take 1,000 Units by mouth daily.   escitalopram (LEXAPRO) 10 MG tablet Take 10 mg by mouth daily.   losartan  (COZAAR ) 50 MG tablet Take 1 tablet (50 mg total) by mouth daily. Pt needs to keep upcoming appt in Mar for further refills   rosuvastatin  (CRESTOR ) 5 MG tablet Take 1 tablet (5 mg total) by mouth daily.   Specialty Vitamins Products (COLLAGEN ULTRA PO) Take by mouth daily.   [DISCONTINUED] phentermine  15 MG capsule Take 1 capsule (15 mg total) by mouth every morning.   meloxicam  (MOBIC ) 7.5 MG tablet Take 1-2 tablets daily by mouth (Patient not taking: Reported on 03/21/2024)   No facility-administered medications prior to visit.   Review of Systems  Constitutional:  Negative for appetite change, chills, fatigue and fever.  Respiratory:  Negative for chest tightness and shortness of breath.   Cardiovascular:  Negative for chest pain and palpitations.  Gastrointestinal:  Negative for abdominal pain, nausea and vomiting.  Neurological:  Negative for dizziness and weakness.       Objective    BP 123/71 (BP Location: Left Arm, Patient Position: Sitting, Cuff Size: Normal)   Pulse 68   Ht 5' 7 (1.702 m)   Wt 206 lb (93.4 kg)   LMP 01/24/2018   SpO2 97%   BMI 32.26 kg/m   Physical Exam   General: Appearance:    Mildly obese female in no acute distress  Eyes:    PERRL, conjunctiva/corneas clear, EOM's intact       Lungs:     Clear to auscultation bilaterally, respirations unlabored  Heart:    Normal heart rate.  Normal rhythm. No murmurs, rubs, or gallops.    MS:   All extremities are intact.    Neurologic:   Awake, alert, oriented x 3. No apparent focal neurological defect.         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 Functional impairment with lifting limitations to 15 pounds. Declined steroid treatment. Seeking disability due to inability to perform  job duties involving heavy lifting. - Advise to check with HR regarding private disability insurance. - Document medical information for potential disability evaluation.  Osteoarthritis with work-related exacerbation Osteoarthritis causing significant discomfort, worsened by work conditions involving standing and lifting.  Foot pain status post surgery Foot pain exacerbated by prolonged standing and heavy lifting at work.  Obesity Obesity with recent weight loss due to improved diet and increased activity. Phentermine  effective in reducing appetite and cravings. Topiramate  discontinued due to perceived worsening of back pain. - Prescribe refills for phentermine .  Hyperlipidemia and hypertension.  Hyperlipidemia, previously managed with Crestor . Last cholesterol check was in March, prior to starting Crestor . - Order fasting cholesterol lab test.  Hypertension Hypertension is well-managed with losartan     Prevnar 20 and flu vaccine given today.      Nancyann Perry, MD  [facility] [phone] (phone) [phone] (fax)  [facility]
